# Patient Record
Sex: Female | Born: 1955 | Hispanic: Refuse to answer | Marital: Married | Smoking: Never smoker
Health system: Southern US, Community
[De-identification: ages and names within clinical notes are randomized; demographics above are authoritative.]

## PROBLEM LIST (undated history)

## (undated) DIAGNOSIS — T7840XA Allergy, unspecified, initial encounter: Secondary | ICD-10-CM

## (undated) DIAGNOSIS — E785 Hyperlipidemia, unspecified: Secondary | ICD-10-CM

## (undated) DIAGNOSIS — A0472 Enterocolitis due to Clostridium difficile, not specified as recurrent: Secondary | ICD-10-CM

## (undated) DIAGNOSIS — K219 Gastro-esophageal reflux disease without esophagitis: Secondary | ICD-10-CM

## (undated) DIAGNOSIS — I10 Essential (primary) hypertension: Secondary | ICD-10-CM

## (undated) DIAGNOSIS — H269 Unspecified cataract: Secondary | ICD-10-CM

## (undated) DIAGNOSIS — R011 Cardiac murmur, unspecified: Secondary | ICD-10-CM

## (undated) DIAGNOSIS — M1990 Unspecified osteoarthritis, unspecified site: Secondary | ICD-10-CM

## (undated) HISTORY — DX: Hyperlipidemia, unspecified: E78.5

## (undated) HISTORY — DX: Unspecified osteoarthritis, unspecified site: M19.90

## (undated) HISTORY — DX: Unspecified cataract: H26.9

## (undated) HISTORY — PX: CATARACT EXTRACTION: SUR2

## (undated) HISTORY — DX: Cardiac murmur, unspecified: R01.1

## (undated) HISTORY — DX: Enterocolitis due to Clostridium difficile, not specified as recurrent: A04.72

## (undated) HISTORY — DX: Essential (primary) hypertension: I10

## (undated) HISTORY — DX: Allergy, unspecified, initial encounter: T78.40XA

## (undated) HISTORY — PX: MIDDLE EAR SURGERY: SHX713

## (undated) HISTORY — PX: BREAST EXCISIONAL BIOPSY: SUR124

## (undated) HISTORY — PX: CARDIAC DEFIBRILLATOR PLACEMENT: SHX171

## (undated) HISTORY — DX: Gastro-esophageal reflux disease without esophagitis: K21.9

---

## 2005-03-17 DIAGNOSIS — R700 Elevated erythrocyte sedimentation rate: Secondary | ICD-10-CM | POA: Insufficient documentation

## 2006-12-04 DIAGNOSIS — D259 Leiomyoma of uterus, unspecified: Secondary | ICD-10-CM | POA: Insufficient documentation

## 2011-03-07 DIAGNOSIS — Z9889 Other specified postprocedural states: Secondary | ICD-10-CM | POA: Insufficient documentation

## 2011-03-29 DIAGNOSIS — I499 Cardiac arrhythmia, unspecified: Secondary | ICD-10-CM | POA: Insufficient documentation

## 2011-03-29 HISTORY — DX: Cardiac arrhythmia, unspecified: I49.9

## 2011-06-26 DIAGNOSIS — IMO0002 Reserved for concepts with insufficient information to code with codable children: Secondary | ICD-10-CM | POA: Insufficient documentation

## 2012-02-29 DIAGNOSIS — I4901 Ventricular fibrillation: Secondary | ICD-10-CM | POA: Insufficient documentation

## 2012-02-29 DIAGNOSIS — I469 Cardiac arrest, cause unspecified: Secondary | ICD-10-CM

## 2012-02-29 HISTORY — DX: Cardiac arrest, cause unspecified: I46.9

## 2012-03-05 DIAGNOSIS — J690 Pneumonitis due to inhalation of food and vomit: Secondary | ICD-10-CM

## 2012-03-05 HISTORY — DX: Pneumonitis due to inhalation of food and vomit: J69.0

## 2013-06-19 DIAGNOSIS — M5430 Sciatica, unspecified side: Secondary | ICD-10-CM | POA: Insufficient documentation

## 2014-08-23 DIAGNOSIS — R7303 Prediabetes: Secondary | ICD-10-CM | POA: Insufficient documentation

## 2014-08-23 DIAGNOSIS — R7301 Impaired fasting glucose: Secondary | ICD-10-CM | POA: Insufficient documentation

## 2014-09-23 DIAGNOSIS — Z95810 Presence of automatic (implantable) cardiac defibrillator: Secondary | ICD-10-CM | POA: Insufficient documentation

## 2015-10-22 DIAGNOSIS — M542 Cervicalgia: Secondary | ICD-10-CM | POA: Insufficient documentation

## 2015-11-19 DIAGNOSIS — M436 Torticollis: Secondary | ICD-10-CM | POA: Insufficient documentation

## 2016-05-06 DIAGNOSIS — N6021 Fibroadenosis of right breast: Secondary | ICD-10-CM | POA: Insufficient documentation

## 2016-06-02 DIAGNOSIS — R55 Syncope and collapse: Secondary | ICD-10-CM | POA: Insufficient documentation

## 2017-06-01 DIAGNOSIS — K648 Other hemorrhoids: Secondary | ICD-10-CM | POA: Insufficient documentation

## 2017-06-01 DIAGNOSIS — Z860100 Personal history of colon polyps, unspecified: Secondary | ICD-10-CM | POA: Insufficient documentation

## 2017-09-06 DIAGNOSIS — R590 Localized enlarged lymph nodes: Secondary | ICD-10-CM

## 2017-09-06 HISTORY — DX: Localized enlarged lymph nodes: R59.0

## 2019-04-15 DIAGNOSIS — N281 Cyst of kidney, acquired: Secondary | ICD-10-CM | POA: Insufficient documentation

## 2019-11-07 LAB — HM COLONOSCOPY

## 2020-12-07 DIAGNOSIS — M7072 Other bursitis of hip, left hip: Secondary | ICD-10-CM | POA: Insufficient documentation

## 2020-12-07 DIAGNOSIS — M7071 Other bursitis of hip, right hip: Secondary | ICD-10-CM | POA: Insufficient documentation

## 2021-01-24 DIAGNOSIS — R7309 Other abnormal glucose: Secondary | ICD-10-CM | POA: Insufficient documentation

## 2021-02-25 DIAGNOSIS — Z9622 Myringotomy tube(s) status: Secondary | ICD-10-CM | POA: Insufficient documentation

## 2021-02-25 DIAGNOSIS — Z961 Presence of intraocular lens: Secondary | ICD-10-CM | POA: Insufficient documentation

## 2021-03-31 DIAGNOSIS — R7303 Prediabetes: Secondary | ICD-10-CM | POA: Insufficient documentation

## 2021-05-17 NOTE — Progress Notes (Signed)
Electrophysiology Office Note:    Date:  05/18/2021   ID:  Christine Fitzpatrick, DOB 1956-03-26, MRN [id number]  PCP:  Pcp, No  [facility] Cardiologist:  None  [facility] Electrophysiologist:  Vickie Epley, MD   Referring MD: No ref. provider found   Chief Complaint: ICD establish care  History of Present Illness:    Christine Fitzpatrick is a 65 y.o. female who presents for an evaluation of ICD as a self-referral. The patient was previously followed by [name] with [facility].    The patient was seen via telemedicine on 12/16/2020. She has a history of SCA  while at work. Cath showed no CAD.   It seems like she had an SVT as well in the past but ablation was deferred. Patient with a recent generator replacement in March 2022.  Patient has a single-chamber Medtronic transvenous ICD.  Patient tells me that she has been on a variety of medications in the past for her SVT including metoprolol, bisoprolol, flecainide.  She has not tolerated long-term therapy with any of the above options.  Metoprolol was okay for a long period of time but it seems like eventually it caused lower blood pressures that were asymptomatic.  Because of the blood pressures, the medication was stopped.  Currently she is taking flecainide once a day only.  She is not on a beta-blocker.  Her ICD is never shocked her.   No past medical history on file.    Current Medications: Current Meds  Medication Sig   Ascorbic Acid (VITAMIN C) 100 MG tablet    atorvastatin (LIPITOR) 40 MG tablet Take 40 mg by mouth daily.   B Complex-C-Folic Acid (RENAL) 1 MG CAPS Take by mouth.   Cholecalciferol (VITAMIN D3) 10 MCG (400 UNIT) tablet Take by mouth.   Coenzyme Q10 100 MG capsule Take 1 capsule by mouth 2 (two) times daily.   flecainide (TAMBOCOR) 50 MG tablet Take 50 mg by mouth daily.   Omega-3 Fatty Acids (OMEGA-3 2100) 1050 MG CAPS Take by mouth.   pregabalin (LYRICA) 25 MG capsule Take 25 mg by mouth daily.      Allergies:   Patient has no allergy information on record.   Social History   Socioeconomic History   Marital status: Married    Spouse name: Not on file   Number of children: Not on file   Years of education: Not on file   Highest education level: Not on file  Occupational History   Not on file  Tobacco Use   Smoking status: Never   Smokeless tobacco: Never  Substance and Sexual Activity   Alcohol use: Yes   Drug use: Never   Sexual activity: Not on file  Other Topics Concern   Not on file  Social History Narrative   Not on file   Social Determinants of Health   Financial Resource Strain: Not on file  Food Insecurity: Not on file  Transportation Needs: Not on file  Physical Activity: Not on file  Stress: Not on file  Social Connections: Not on file     Family History: The patient's family history is not on file.  ROS:   Please see the history of present illness.    All other systems reviewed and are negative.  EKGs/Labs/Other Studies Reviewed:    The following studies were reviewed today:  May 18, 2021 in clinic device interrogation personally reviewed Lead parameter stable 80+ episodes of SVT with a regular rhythm at 150 bpm  on average.  Episodes typically last seconds to slightly over 1 minute No ICD therapies No programming changes made today.   12/16/2020 Stress (outside records) Summary 1. Myocardial perfusion imaging demonstrates a small-to-moderate sized area of mildly decreased perfusion in the anterior region near the base on both the rest and stress images. This perfusion abnormality is associated with normal wall motion and is most likely due to soft tissue attenuation artifact from overlying breast tissue. There are no areas of ischemia. 2. Left ventricular size and function are normal. 3. Clinical and EKG response: non-ischemic.     EKG:  The ekg ordered today demonstrates sinus rhythm, PVC, QTC 467.  PR interval 176 ms.  Recent  Labs: No results found for requested labs within last 8760 hours.  Recent Lipid Panel No results found for: CHOL, TRIG, HDL, CHOLHDL, VLDL, LDLCALC, LDLDIRECT  Physical Exam:    VS:  BP 134/86 (BP Location: Left Arm, Patient Position: Sitting, Cuff Size: Normal)   Pulse 98   Ht 5\' 8"  (1.727 m)   Wt 182 lb (82.6 kg)   SpO2 98%   BMI 27.67 kg/m     Wt Readings from Last 3 Encounters:  05/18/21 182 lb (82.6 kg)     GEN:  Well nourished, well developed in no acute distress HEENT: Normal NECK: No JVD; No carotid bruits LYMPHATICS: No lymphadenopathy CARDIAC: RRR, no murmurs, rubs, gallops.  Left chest ICD well-healed RESPIRATORY:  Clear to auscultation without rales, wheezing or rhonchi  ABDOMEN: Soft, non-tender, non-distended MUSCULOSKELETAL:  No edema; No deformity  SKIN: Warm and dry NEUROLOGIC:  Alert and oriented x 3 PSYCHIATRIC:  Normal affect   ASSESSMENT:    1. History of cardiac arrest   2. Ventricular tachycardia ([name])   3. SVT (supraventricular tachycardia) ([name])    PLAN:    In order of problems listed above:  1. History of cardiac arrest Now with ICD in situ.  ICD has never shocked her.  2. Ventricular tachycardia ([name]) ICD functioning well   3. SVT (supraventricular tachycardia) ([name]) Symptomatic.  Episodes typically last seconds at a time.  Review of ICD suggest repetitive SVT episodes that last typically a few seconds or slightly over 1 minute for one of the longest episodes last all.  Heart rates are typically 150 bpm.  Unfortunately there is no atrial data available on her single-chamber ICD.  We will plan to apply a 2-week ZIO monitor to see if we can acquire any atrial data suggestive of atrial flutter versus another SVT.  For now, I would like to discontinue her flecainide given she is only taking it once a day without beta-blockade.  I would like to restart low-dose Toprol-XL 25 mg by mouth once daily at night.  I will plan to see her back in  about 6 to 8 weeks to review the results of her ZIO monitor and to discuss neck steps for her SVT.  We will establish her in her device clinic for continued remote monitoring.        Total time spent with patient today 45 minutes. This includes reviewing records, evaluating the patient and coordinating care.  Medication Adjustments/Labs and Tests Ordered: Current medicines are reviewed at length with the patient today.  Concerns regarding medicines are outlined above.  No orders of the defined types were placed in this encounter.  No orders of the defined types were placed in this encounter.    Signed, Hilton Cork. Quentin Ore, MD, [facility], [facility] 05/18/2021 9:37 AM  Electrophysiology [facility]

## 2021-05-18 ENCOUNTER — Telehealth: Payer: Self-pay

## 2021-05-18 ENCOUNTER — Ambulatory Visit (INDEPENDENT_AMBULATORY_CARE_PROVIDER_SITE_OTHER): Payer: Managed Care, Other (non HMO) | Admitting: Cardiology

## 2021-05-18 ENCOUNTER — Other Ambulatory Visit: Payer: Self-pay

## 2021-05-18 ENCOUNTER — Ambulatory Visit (INDEPENDENT_AMBULATORY_CARE_PROVIDER_SITE_OTHER): Payer: Managed Care, Other (non HMO)

## 2021-05-18 ENCOUNTER — Encounter: Payer: Self-pay | Admitting: Cardiology

## 2021-05-18 VITALS — BP 134/86 | HR 98 | Ht 68.0 in | Wt 182.0 lb

## 2021-05-18 DIAGNOSIS — I472 Ventricular tachycardia, unspecified: Secondary | ICD-10-CM

## 2021-05-18 DIAGNOSIS — I471 Supraventricular tachycardia: Secondary | ICD-10-CM

## 2021-05-18 DIAGNOSIS — Z8674 Personal history of sudden cardiac arrest: Secondary | ICD-10-CM

## 2021-05-18 LAB — PACEMAKER DEVICE OBSERVATION

## 2021-05-18 MED ORDER — METOPROLOL SUCCINATE ER 25 MG PO TB24: 25.0000 mg | ORAL_TABLET | Freq: Every day | ORAL | 3 refills | Status: DC

## 2021-05-18 NOTE — Telephone Encounter (Signed)
Left message with answering service requesting callback from Pacemaker/ICD clinic to request transfer of Remote monitoring to our clinic.  Request also submitted in [facility].

## 2021-05-18 NOTE — Patient Instructions (Addendum)
Medication Instructions:  Your physician has recommended you make the following change in your medication:   1  STOP flecainide  2.  START taking metoprolol succinate 25 mg-  Take one tablet by mouth daily   Lab Work: None ordered. If you have labs (blood work) drawn today and your tests are completely normal, you will receive your results only by: [place] (if you have MyChart) OR A paper copy in the mail If you have any lab test that is abnormal or we need to change your treatment, we will call you to review the results.  Testing/Procedures: Your physician has recommended that you wear a holter monitor. Holter monitors are medical devices that record the heart's electrical activity. Doctors most often use these monitors to diagnose arrhythmias. Arrhythmias are problems with the speed or rhythm of the heartbeat. The monitor is a small, portable device. You can wear one while you do your normal daily activities. This is usually used to diagnose what is causing palpitations/syncope (passing out).  You will wear a ZIO monitor for 14 days.  Follow-Up: At [facility], you and your health needs are our priority.  As part of our continuing mission to provide you with exceptional heart care, we have created designated Provider Care Teams.  These Care Teams include your primary Cardiologist (physician) and Advanced Practice Providers (APPs -  Physician Assistants and Nurse Practitioners) who all work together to provide you with the care you need, when you need it.  Your next appointment:   Your physician wants you to follow-up in: 6-8 weeks with Dr. Quentin Ore.      Remote monitoring is used to monitor your ICD from home.   We will request to have your remote monitoring transferred to [facility].  Device clinic phone number:  [phone]  Your physician has recommended that you wear a Zio monitor.   This monitor is a medical device that records the heart's electrical activity.  Doctors most often use these monitors to diagnose arrhythmias. Arrhythmias are problems with the speed or rhythm of the heartbeat. The monitor is a small device applied to your chest. You can wear one while you do your normal daily activities. While wearing this monitor if you have any symptoms to push the button and record what you felt. Once you have worn this monitor for the period of time provider prescribed (Usually 14 days), you will return the monitor device in the postage paid box. Once it is returned they will download the data collected and provide [place] with a report which the provider will then review and we will call you with those results. Important tips:  Avoid showering during the first 24 hours of wearing the monitor. Avoid excessive sweating to help maximize wear time. Do not submerge the device, no hot tubs, and no swimming pools. Keep any lotions or oils away from the patch. After 24 hours you may shower with the patch on. Take brief showers with your back facing the shower head.  Do not remove patch once it has been placed because that will interrupt data and decrease adhesive wear time. Push the button when you have any symptoms and write down what you were feeling. Once you have completed wearing your monitor, remove and place into box which has postage paid and place in your outgoing mailbox.  If for some reason you have misplaced your box then call our office and we can provide another box and/or mail it off for you.

## 2021-05-20 ENCOUNTER — Ambulatory Visit (LOCAL_COMMUNITY_HEALTH_CENTER): Payer: Managed Care, Other (non HMO)

## 2021-05-20 ENCOUNTER — Other Ambulatory Visit: Payer: Self-pay

## 2021-05-20 DIAGNOSIS — Z111 Encounter for screening for respiratory tuberculosis: Secondary | ICD-10-CM

## 2021-05-20 NOTE — Telephone Encounter (Signed)
Pt is in our clinic as of 05-18-2021. She was put into our system by Amy, rn.

## 2021-05-23 ENCOUNTER — Ambulatory Visit (LOCAL_COMMUNITY_HEALTH_CENTER): Payer: Managed Care, Other (non HMO)

## 2021-05-23 DIAGNOSIS — Z111 Encounter for screening for respiratory tuberculosis: Secondary | ICD-10-CM

## 2021-05-23 LAB — TB SKIN TEST
Induration: 0 mm
TB Skin Test: NEGATIVE

## 2021-05-27 ENCOUNTER — Other Ambulatory Visit: Payer: Self-pay

## 2021-05-27 ENCOUNTER — Ambulatory Visit (LOCAL_COMMUNITY_HEALTH_CENTER): Payer: Self-pay

## 2021-05-27 DIAGNOSIS — Z0184 Encounter for antibody response examination: Secondary | ICD-10-CM

## 2021-05-27 NOTE — Progress Notes (Signed)
In Nurse Clinic for [place]. 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 Reports childhood vaccines, but no record. Limited vaccines in Epic and these were placed into [facility]. ROI signed. RN plans to call pt with results. Pt says she has MyChart but will need a paper copy of results. RN walked pt to lab. Josie Saunders, RN

## 2021-05-28 LAB — HEPATITIS B SURFACE ANTIBODY, QUANTITATIVE: Hepatitis B Surf Ab Quant: 221.8 m[IU]/mL (ref >9.9)

## 2021-05-29 LAB — MEASLES/MUMPS/RUBELLA IMMUNITY
MUMPS ABS, IGG: >300 AU/mL (ref >10.9)
RUBEOLA AB, IGG: >300 AU/mL (ref >16.4)
Rubella Antibodies, IGG: 1.36 index (ref >0.99)

## 2021-05-29 LAB — VARICELLA ZOSTER ANTIBODY, IGG: Varicella zoster IgG: >4000 index (ref >165)

## 2021-05-31 NOTE — Telephone Encounter (Signed)
Spoke with pt, advised we are now receiving her remote monitors.  Next scheduled check 08/17/21.

## 2021-06-06 ENCOUNTER — Encounter: Payer: Self-pay | Admitting: Internal Medicine

## 2021-06-06 ENCOUNTER — Other Ambulatory Visit: Payer: Self-pay

## 2021-06-06 ENCOUNTER — Ambulatory Visit (INDEPENDENT_AMBULATORY_CARE_PROVIDER_SITE_OTHER): Payer: Managed Care, Other (non HMO) | Admitting: Internal Medicine

## 2021-06-06 VITALS — BP 125/88 | HR 80 | Ht 68.0 in | Wt 183.4 lb

## 2021-06-06 DIAGNOSIS — I472 Ventricular tachycardia, unspecified: Secondary | ICD-10-CM | POA: Insufficient documentation

## 2021-06-06 DIAGNOSIS — Z8674 Personal history of sudden cardiac arrest: Secondary | ICD-10-CM

## 2021-06-06 DIAGNOSIS — I471 Supraventricular tachycardia, unspecified: Secondary | ICD-10-CM | POA: Insufficient documentation

## 2021-06-06 NOTE — Assessment & Plan Note (Signed)
Patient has a history of atrial flutter or supraventricular tachycardia she has a monitor placed to check the supraventricular arrhythmia.

## 2021-06-06 NOTE — Assessment & Plan Note (Signed)
Patient has a history of cardiac arrest ventricular tachycardia probably has an ICD placed in [place] which is working well.

## 2021-06-06 NOTE — Assessment & Plan Note (Signed)
>>  ASSESSMENT AND PLAN FOR VENTRICULAR TACHYCARDIA ([name]) WRITTEN ON 06/06/2021  9:59 AM BY MASOUD, JAVED, MD  Patient has a history of cardiac arrest ventricular tachycardia probably has an ICD placed in [place]  which is working well.

## 2021-06-06 NOTE — Assessment & Plan Note (Signed)
Patient has a cardiac arrest, she has an ICD placed.  She was on flecainide which was discontinued recently, she is on beta-blocker alone.

## 2021-06-06 NOTE — Progress Notes (Signed)
New Patient Office Visit  Subjective:  Patient ID: Christine Fitzpatrick, female    DOB: Jan 16, 1956  Age: 65 y.o. MRN: [id number]  CC:  Chief Complaint  Patient presents with   New Patient (Initial Visit)    HPI Patient presents for new pt visit  Past Medical History:  Diagnosis Date   Hyperlipidemia    Hypertension      Current Outpatient Medications:    Ascorbic Acid (VITAMIN C) 100 MG tablet, , Disp: , Rfl:    B Complex-C-Folic Acid (RENAL) 1 MG CAPS, Take by mouth., Disp: , Rfl:    Cholecalciferol (VITAMIN D3) 10 MCG (400 UNIT) tablet, Take by mouth., Disp: , Rfl:    Coenzyme Q10 100 MG capsule, Take 1 capsule by mouth 2 (two) times daily., Disp: , Rfl:    metoprolol succinate (TOPROL XL) 25 MG 24 hr tablet, Take 1 tablet (25 mg total) by mouth daily., Disp: 90 tablet, Rfl: 3   Omega-3 Fatty Acids (OMEGA-3 2100) 1050 MG CAPS, Take by mouth., Disp: , Rfl:    atorvastatin (LIPITOR) 40 MG tablet, Take 40 mg by mouth daily. (Patient not taking: No sig reported), Disp: , Rfl:    Past Surgical History:  Procedure Laterality Date   CARDIAC DEFIBRILLATOR PLACEMENT     CATARACT EXTRACTION Left    MIDDLE EAR SURGERY Left     Family History  Problem Relation Age of Onset   Hypertension Mother    Hypertension Maternal Aunt    Diabetes Maternal Grandmother     Social History   Socioeconomic History   Marital status: Married    Spouse name: Not on file   Number of children: Not on file   Years of education: Not on file   Highest education level: Not on file  Occupational History   Not on file  Tobacco Use   Smoking status: Never   Smokeless tobacco: Never  Substance and Sexual Activity   Alcohol use: Yes   Drug use: Never   Sexual activity: Not on file  Other Topics Concern   Not on file  Social History Narrative   Not on file   Social Determinants of Health   Financial Resource Strain: Not on file  Food Insecurity: Not on file  Transportation Needs: Not on file   Physical Activity: Not on file  Stress: Not on file  Social Connections: Not on file  Intimate Partner Violence: Not on file    ROS Review of Systems  Constitutional: Negative.   HENT: Negative.         Pe  tube  lt ear  Eyes:        Catract  surgery  lt eye  Respiratory: Negative.    Cardiovascular: Negative.        Defib lt side  Gastrointestinal: Negative.  Negative for abdominal distention, abdominal pain, anal bleeding, blood in stool and constipation.  Endocrine: Negative.   Genitourinary: Negative.   Musculoskeletal: Negative.        Epidural  injection  Skin: Negative.   Allergic/Immunologic: Negative.   Neurological: Negative.        Laminectomy L4/L5  Hematological: Negative.   Psychiatric/Behavioral: Negative.    All other systems reviewed and are negative.  Objective:   Today's Vitals: BP 125/88   Pulse 80   Ht '5\' 8"'$  (1.727 m)   Wt 183 lb 6.4 oz (83.2 kg)   BMI 27.89 kg/m   Physical Exam Constitutional:      Appearance: She  is normal weight.  HENT:     Head: Normocephalic.     Nose: Nose normal.     Mouth/Throat:     Mouth: Mucous membranes are moist.  Eyes:     Pupils: Pupils are equal, round, and reactive to light.  Cardiovascular:     Rate and Rhythm: Normal rate and regular rhythm.     Comments: Pt has defib  lt side due to cardiac  arrest Neurological:     Mental Status: She is alert.    Assessment & Plan:   Problem List Items Addressed This Visit       Cardiovascular and Mediastinum   Ventricular tachycardia [facility])    Patient has a history of cardiac arrest ventricular tachycardia probably has an ICD placed in [place] which is working well.       Supraventricular tachycardia ([place])    Patient has a history of atrial flutter or supraventricular tachycardia she has a monitor placed to check the supraventricular arrhythmia.         Other   History of cardiac arrest - Primary  Patient has an ICD on the left side  Outpatient  Encounter Medications as of 06/06/2021  Medication Sig   Ascorbic Acid (VITAMIN C) 100 MG tablet    B Complex-C-Folic Acid (RENAL) 1 MG CAPS Take by mouth.   Cholecalciferol (VITAMIN D3) 10 MCG (400 UNIT) tablet Take by mouth.   Coenzyme Q10 100 MG capsule Take 1 capsule by mouth 2 (two) times daily.   metoprolol succinate (TOPROL XL) 25 MG 24 hr tablet Take 1 tablet (25 mg total) by mouth daily.   Omega-3 Fatty Acids (OMEGA-3 2100) 1050 MG CAPS Take by mouth.   atorvastatin (LIPITOR) 40 MG tablet Take 40 mg by mouth daily. (Patient not taking: No sig reported)   [DISCONTINUED] pregabalin (LYRICA) 25 MG capsule Take 25 mg by mouth daily. (Patient not taking: Reported on 05/20/2021)   No facility-administered encounter medications on file as of 06/06/2021.       Follow-up: No follow-ups on file.   Cletis Athens, MD

## 2021-06-14 ENCOUNTER — Ambulatory Visit (INDEPENDENT_AMBULATORY_CARE_PROVIDER_SITE_OTHER): Payer: Managed Care, Other (non HMO) | Admitting: Internal Medicine

## 2021-06-14 ENCOUNTER — Other Ambulatory Visit: Payer: Self-pay

## 2021-06-14 ENCOUNTER — Encounter: Payer: Self-pay | Admitting: Internal Medicine

## 2021-06-14 VITALS — BP 121/73 | HR 89 | Ht 68.0 in | Wt 184.9 lb

## 2021-06-14 DIAGNOSIS — M1712 Unilateral primary osteoarthritis, left knee: Secondary | ICD-10-CM | POA: Diagnosis not present

## 2021-06-14 NOTE — Progress Notes (Signed)
Established Patient Office Visit  Subjective:  Patient ID: Christine Fitzpatrick, female    DOB: 06-Apr-1956  Age: 65 y.o. MRN: [id number]  CC:  Chief Complaint  Patient presents with   Knee Pain    Patient is requesting steroid shot in her knee     Knee Pain  The incident occurred more than 1 week ago. There was no injury mechanism. The pain is present in the left leg. The quality of the pain is described as aching. The pain is at a severity of 4/10.   Christine Fitzpatrick presents for lt knee pain Past Medical History:  Diagnosis Date   Hyperlipidemia    Hypertension     Past Surgical History:  Procedure Laterality Date   CARDIAC DEFIBRILLATOR PLACEMENT     CATARACT EXTRACTION Left    MIDDLE EAR SURGERY Left     Family History  Problem Relation Age of Onset   Hypertension Mother    Hypertension Maternal Aunt    Diabetes Maternal Grandmother     Social History   Socioeconomic History   Marital status: Married    Spouse name: Not on file   Number of children: Not on file   Years of education: Not on file   Highest education level: Not on file  Occupational History   Not on file  Tobacco Use   Smoking status: Never   Smokeless tobacco: Never  Substance and Sexual Activity   Alcohol use: Yes   Drug use: Never   Sexual activity: Not on file  Other Topics Concern   Not on file  Social History Narrative   Not on file   Social Determinants of Health   Financial Resource Strain: Not on file  Food Insecurity: Not on file  Transportation Needs: Not on file  Physical Activity: Not on file  Stress: Not on file  Social Connections: Not on file  Intimate Partner Violence: Not on file     Current Outpatient Medications:    Ascorbic Acid (VITAMIN C) 100 MG tablet, , Disp: , Rfl:    atorvastatin (LIPITOR) 40 MG tablet, Take 40 mg by mouth daily., Disp: , Rfl:    B Complex-C-Folic Acid (RENAL) 1 MG CAPS, Take by mouth., Disp: , Rfl:    Cholecalciferol (VITAMIN D3) 10 MCG (400  UNIT) tablet, Take by mouth., Disp: , Rfl:    Coenzyme Q10 100 MG capsule, Take 1 capsule by mouth 2 (two) times daily., Disp: , Rfl:    metoprolol succinate (TOPROL XL) 25 MG 24 hr tablet, Take 1 tablet (25 mg total) by mouth daily., Disp: 90 tablet, Rfl: 3   Omega-3 Fatty Acids (OMEGA-3 2100) 1050 MG CAPS, Take by mouth., Disp: , Rfl:    No Known Allergies  ROS Review of Systems  Constitutional: Negative.   HENT: Negative.    Eyes: Negative.   Respiratory: Negative.    Cardiovascular: Negative.   Gastrointestinal: Negative.   Endocrine: Negative.   Genitourinary: Negative.   Musculoskeletal: Negative.   Skin: Negative.   Allergic/Immunologic: Negative.   Neurological: Negative.   Hematological: Negative.   Psychiatric/Behavioral: Negative.    All other systems reviewed and are negative.    Objective:    Physical Exam Vitals reviewed.  Constitutional:      Appearance: Normal appearance.  HENT:     Mouth/Throat:     Mouth: Mucous membranes are moist.  Eyes:     Pupils: Pupils are equal, round, and reactive to light.  Neck:  Vascular: No carotid bruit.  Cardiovascular:     Rate and Rhythm: Normal rate and regular rhythm.     Pulses: Normal pulses.     Heart sounds: Normal heart sounds.  Pulmonary:     Effort: Pulmonary effort is normal.     Breath sounds: Normal breath sounds.  Abdominal:     General: Bowel sounds are normal.     Palpations: Abdomen is soft. There is no hepatomegaly, splenomegaly or mass.     Tenderness: There is no abdominal tenderness.     Hernia: No hernia is present.  Musculoskeletal:        General: No tenderness.     Cervical back: Neck supple.     Right lower leg: No edema.     Left lower leg: No edema.     Comments: Lt knee pain  Skin:    Findings: No rash.  Neurological:     Mental Status: She is alert and oriented to person, place, and time.     Motor: No weakness.  Psychiatric:        Mood and Affect: Mood and affect normal.         Behavior: Behavior normal.    BP 121/73   Pulse 89   Ht $R'5\' 8"'ca$  (1.727 m)   Wt 184 lb 14.4 oz (83.9 kg)   BMI 28.11 kg/m  Wt Readings from Last 3 Encounters:  06/14/21 184 lb 14.4 oz (83.9 kg)  06/06/21 183 lb 6.4 oz (83.2 kg)  05/18/21 182 lb (82.6 kg)     Health Maintenance Due  Topic Date Due   HIV Screening  Never done   Hepatitis C Screening  Never done   PAP SMEAR-Modifier  Never done   COLONOSCOPY (Pts 45-46yrs Insurance coverage will need to be confirmed)  Never done   MAMMOGRAM  Never done   TETANUS/TDAP  12/08/2018   INFLUENZA VACCINE  06/06/2021    There are no preventive care reminders to display for this patient.  No results found for: TSH No results found for: WBC, HGB, HCT, MCV, PLT No results found for: NA, K, CHLORIDE, CO2, GLUCOSE, BUN, CREATININE, BILITOT, ALKPHOS, AST, ALT, PROT, ALBUMIN, CALCIUM, ANIONGAP, EGFR, GFR No results found for: CHOL No results found for: HDL No results found for: LDLCALC No results found for: TRIG No results found for: CHOLHDL No results found for: HGBA1C    Assessment & Plan:   Problem List Items Addressed This Visit   None Joint Injection/Arthrocentesis  Date/Time: 06/14/2021 2:37 PM Performed by: Cletis Athens, MD Authorized by: Cletis Athens, MD  Indications: pain and joint swelling  Body area: knee Joint: left knee  Sedation: Patient sedated: no  Preparation: Patient was prepped and draped in the usual sterile fashion. Needle size: 22 G Ultrasound guidance: no Approach: medial Betamethasone amount: 40 mg Comments: Left knee was prepared and under sterile condition medial side of the left knee was injected with 40 mg of Kenalog.  Patient tolerated the procedure well.     No orders of the defined types were placed in this encounter.   Follow-up: No follow-ups on file.    Cletis Athens, MD

## 2021-06-14 NOTE — Assessment & Plan Note (Signed)
Patient has osteoarthritis of the left knee she was injected with 40 mg of Kenalog, patient tolerated the procedure well

## 2021-07-06 ENCOUNTER — Encounter: Payer: Self-pay | Admitting: Cardiology

## 2021-07-06 ENCOUNTER — Ambulatory Visit (INDEPENDENT_AMBULATORY_CARE_PROVIDER_SITE_OTHER): Payer: Managed Care, Other (non HMO) | Admitting: Cardiology

## 2021-07-06 ENCOUNTER — Other Ambulatory Visit: Payer: Self-pay

## 2021-07-06 VITALS — BP 120/84 | HR 95 | Ht 68.0 in | Wt 186.0 lb

## 2021-07-06 DIAGNOSIS — Z8674 Personal history of sudden cardiac arrest: Secondary | ICD-10-CM | POA: Diagnosis not present

## 2021-07-06 DIAGNOSIS — I472 Ventricular tachycardia, unspecified: Secondary | ICD-10-CM

## 2021-07-06 DIAGNOSIS — I471 Supraventricular tachycardia: Secondary | ICD-10-CM

## 2021-07-06 DIAGNOSIS — Z95810 Presence of automatic (implantable) cardiac defibrillator: Secondary | ICD-10-CM

## 2021-07-06 NOTE — Progress Notes (Signed)
Electrophysiology Office Follow up Visit Note:    Date:  07/06/2021   ID:  Christine Fitzpatrick, DOB 07/27/1956, MRN [id number]  PCP:  Cletis Athens, MD  [facility] Cardiologist:  None  [facility] Electrophysiologist:  Vickie Epley, MD    Interval History:    Christine Fitzpatrick is a 65 y.o. female who presents for a follow up visit. They were last seen in clinic May 18, 2021.  The patient was planning to establish care for her ICD.  She has a single-chamber Medtronic transvenous ICD.  She has a history of SVT but ablation was deferred in the past.  Given she has a single-chamber ICD, a Zio patch was applied at the last office visit to try to better characterize her SVT. Today she tells me she is doing very well.  She is almost done with a home remodel which has been the source of significant stress.    Past Medical History:  Diagnosis Date   Hyperlipidemia    Hypertension     Past Surgical History:  Procedure Laterality Date   CARDIAC DEFIBRILLATOR PLACEMENT     CATARACT EXTRACTION Left    MIDDLE EAR SURGERY Left     Current Medications: Current Meds  Medication Sig   Ascorbic Acid (VITAMIN C) 100 MG tablet    atorvastatin (LIPITOR) 10 MG tablet Take 10 mg by mouth daily.   cetirizine (ZYRTEC) 10 MG tablet Take 10 mg by mouth daily as needed for allergies.   Cholecalciferol (VITAMIN D3) 10 MCG (400 UNIT) tablet Take by mouth.   Coenzyme Q10 100 MG capsule Take 1 capsule by mouth 2 (two) times daily.   fluticasone (FLONASE) 50 MCG/ACT nasal spray Place into both nostrils daily as needed for allergies or rhinitis.   metoprolol succinate (TOPROL XL) 25 MG 24 hr tablet Take 1 tablet (25 mg total) by mouth daily.   Omega-3 Fatty Acids (OMEGA-3 2100) 1050 MG CAPS Take by mouth.     Allergies:   Patient has no known allergies.   Social History   Socioeconomic History   Marital status: Married    Spouse name: Not on file   Number of children: Not on file   Years of education:  Not on file   Highest education level: Not on file  Occupational History   Not on file  Tobacco Use   Smoking status: Never   Smokeless tobacco: Never  Substance and Sexual Activity   Alcohol use: Yes   Drug use: Never   Sexual activity: Not on file  Other Topics Concern   Not on file  Social History Narrative   Not on file   Social Determinants of Health   Financial Resource Strain: Not on file  Food Insecurity: Not on file  Transportation Needs: Not on file  Physical Activity: Not on file  Stress: Not on file  Social Connections: Not on file     Family History: The patient's family history includes Diabetes in her maternal grandmother; Hypertension in her maternal aunt and mother.  ROS:   Please see the history of present illness.    All other systems reviewed and are negative.  EKGs/Labs/Other Studies Reviewed:    The following studies were reviewed today:  June 07, 2021 ZIO personally reviewed HR 57 - 160bpm, average 83 bpm. 5 SVT, longest lasting 4 beats.  Rare supraventricular ectopy. 1.2% PVC burden. No sustained arrhythmias.  July 06, 2021 in clinic device interrogation personally reviewed Stable lead parameters no ICD therapies  delivered.  EKG:  The ekg ordered today demonstrates sinus rhythm.  Normal intervals.  Recent Labs: No results found for requested labs within last 8760 hours.  Recent Lipid Panel No results found for: CHOL, TRIG, HDL, CHOLHDL, VLDL, LDLCALC, LDLDIRECT  Physical Exam:    VS:  BP 120/84 (BP Location: Left Arm, Patient Position: Sitting, Cuff Size: Normal)   Pulse 95   Ht '5\' 8"'$  (1.727 m)   Wt 186 lb (84.4 kg)   SpO2 98%   BMI 28.28 kg/m     Wt Readings from Last 3 Encounters:  07/06/21 186 lb (84.4 kg)  06/14/21 184 lb 14.4 oz (83.9 kg)  06/06/21 183 lb 6.4 oz (83.2 kg)     GEN:  Well nourished, well developed in no acute distress HEENT: Normal NECK: No JVD; No carotid bruits LYMPHATICS: No  lymphadenopathy CARDIAC: RRR, no murmurs, rubs, gallops.  ICD pocket well-healed. RESPIRATORY:  Clear to auscultation without rales, wheezing or rhonchi  ABDOMEN: Soft, non-tender, non-distended MUSCULOSKELETAL:  No edema; No deformity  SKIN: Warm and dry NEUROLOGIC:  Alert and oriented x 3 PSYCHIATRIC:  Normal affect   ASSESSMENT:    1. SVT (supraventricular tachycardia) ([place])   2. Ventricular tachycardia ([place])   3. Implantable cardioverter-defibrillator (ICD) in situ    PLAN:    In order of problems listed above:   1. SVT (supraventricular tachycardia) ([name]) Very low burden on ZIO.  Doing well on metoprolol.  I would not restart flecainide.  She is very pleased with how she is feeling on metoprolol.  2. Ventricular tachycardia ([place]) No ICD therapies.  Doing well with defibrillator.  We will establish for remote monitoring with our clinic.  3. Implantable cardioverter-defibrillator (ICD) in situ Device interrogation shows a well-functioning device today.  We will establish her for remote monitoring in our clinic.    Medication Adjustments/Labs and Tests Ordered: Current medicines are reviewed at length with the patient today.  Concerns regarding medicines are outlined above.  No orders of the defined types were placed in this encounter.  No orders of the defined types were placed in this encounter.    Signed, Lars Mage, MD, [facility], [facility] 07/06/2021 2:45 PM    Electrophysiology [facility]

## 2021-07-06 NOTE — Patient Instructions (Signed)
Medication Instructions:  - Your physician recommends that you continue on your current medications as directed. Please refer to the Current Medication list given to you today.  *If you need a refill on your cardiac medications before your next appointment, please call your pharmacy*   Lab Work: - none ordered  If you have labs (blood work) drawn today and your tests are completely normal, you will receive your results only by: [place] (if you have MyChart) OR A paper copy in the mail If you have any lab test that is abnormal or we need to change your treatment, we will call you to review the results.   Testing/Procedures: - none ordered   Follow-Up: At [facility], you and your health needs are our priority.  As part of our continuing mission to provide you with exceptional heart care, we have created designated Provider Care Teams.  These Care Teams include your primary Cardiologist (physician) and Advanced Practice Providers (APPs -  Physician Assistants and Nurse Practitioners) who all work together to provide you with the care you need, when you need it.  We recommend signing up for the patient portal called "MyChart".  Sign up information is provided on this After Visit Summary.  MyChart is used to connect with patients for Virtual Visits (Telemedicine).  Patients are able to view lab/test results, encounter notes, upcoming appointments, etc.  Non-urgent messages can be sent to your provider as well.   To learn more about what you can do with MyChart, go to [email or web address].    Your next appointment:   1 year(s)  The format for your next appointment:   In Person  Provider:   Lars Mage, MD   Other Instructions N/a

## 2021-08-17 ENCOUNTER — Ambulatory Visit (INDEPENDENT_AMBULATORY_CARE_PROVIDER_SITE_OTHER): Payer: Managed Care, Other (non HMO)

## 2021-08-17 DIAGNOSIS — I471 Supraventricular tachycardia: Secondary | ICD-10-CM

## 2021-08-19 LAB — CUP PACEART REMOTE DEVICE CHECK
Battery Remaining Longevity: 160 mo
Battery Voltage: 3.05 V
Brady Statistic RA Percent Paced: INVALID
Brady Statistic RV Percent Paced: 0.13 %
Date Time Interrogation Session: 20221011183849
HighPow Impedance: 41 Ohm
HighPow Impedance: 50 Ohm
Implantable Lead Implant Date: 20130501
Implantable Lead Location: 753860
Implantable Pulse Generator Implant Date: 20220322
Lead Channel Impedance Value: 513 Ohm
Lead Channel Impedance Value: 589 Ohm
Lead Channel Pacing Threshold Amplitude: 0.5 V
Lead Channel Pacing Threshold Pulse Width: 0.4 ms
Lead Channel Sensing Intrinsic Amplitude: 6.9 mV
Lead Channel Setting Pacing Amplitude: 1.5 V
Lead Channel Setting Pacing Pulse Width: 0.4 ms
Lead Channel Setting Sensing Sensitivity: 0.45 mV

## 2021-08-25 NOTE — Progress Notes (Signed)
Remote ICD transmission.   

## 2021-10-06 ENCOUNTER — Encounter: Payer: Self-pay | Admitting: Internal Medicine

## 2021-10-18 ENCOUNTER — Other Ambulatory Visit: Payer: Self-pay | Admitting: *Deleted

## 2021-10-18 MED ORDER — AZITHROMYCIN 250 MG PO TABS: ORAL_TABLET | ORAL | 0 refills | Status: DC

## 2021-11-15 ENCOUNTER — Encounter: Payer: Self-pay | Admitting: Cardiology

## 2021-11-15 LAB — CUP PACEART REMOTE DEVICE CHECK
Battery Remaining Longevity: 158 mo
Battery Voltage: 3.05 V
Brady Statistic RV Percent Paced: 0.09 %
Date Time Interrogation Session: 20230110070245
HighPow Impedance: 41 Ohm
HighPow Impedance: 51 Ohm
Implantable Lead Implant Date: 20130501
Implantable Lead Location: 753860
Implantable Pulse Generator Implant Date: 20220322
Lead Channel Impedance Value: 532 Ohm
Lead Channel Impedance Value: 608 Ohm
Lead Channel Pacing Threshold Amplitude: 0.5 V
Lead Channel Pacing Threshold Pulse Width: 0.4 ms
Lead Channel Sensing Intrinsic Amplitude: 9.1 mV
Lead Channel Setting Pacing Amplitude: 1.5 V
Lead Channel Setting Pacing Pulse Width: 0.4 ms
Lead Channel Setting Sensing Sensitivity: 0.45 mV

## 2021-11-16 ENCOUNTER — Telehealth: Payer: Self-pay

## 2021-11-16 ENCOUNTER — Ambulatory Visit (INDEPENDENT_AMBULATORY_CARE_PROVIDER_SITE_OTHER): Payer: Managed Care, Other (non HMO)

## 2021-11-16 DIAGNOSIS — I472 Ventricular tachycardia, unspecified: Secondary | ICD-10-CM

## 2021-11-16 NOTE — Telephone Encounter (Signed)
Unsuccessful telephone encounter to patient to follow up on need to discuss SVT episodes noted on 11/15/21 remote transmission. Hipaa compliant VM message left requesting call back to [phone].

## 2021-11-16 NOTE — Telephone Encounter (Signed)
Returning patient call related to most recent transmission and her concern with number of SVT episodes. After review and discussion it appears most episodes occurred over the holidays of Christmas and Thanksgiving. Patient does admit to travel and spending time with family. Denies negative stress but embraces excitement with seeing family and doing family activities. Known history of SVT. Toprol XL prescribed. Will continue to monitor episodes. Patient appreciative of follow up.

## 2021-11-16 NOTE — Telephone Encounter (Signed)
Patient  called back returning call and would like a call back  

## 2021-11-21 ENCOUNTER — Encounter: Payer: Self-pay | Admitting: Internal Medicine

## 2021-11-21 ENCOUNTER — Other Ambulatory Visit: Payer: Self-pay

## 2021-11-21 ENCOUNTER — Ambulatory Visit (INDEPENDENT_AMBULATORY_CARE_PROVIDER_SITE_OTHER): Payer: Managed Care, Other (non HMO) | Admitting: Internal Medicine

## 2021-11-21 ENCOUNTER — Other Ambulatory Visit: Payer: Self-pay | Admitting: *Deleted

## 2021-11-21 VITALS — BP 130/81 | HR 77 | Ht 68.0 in | Wt 196.0 lb

## 2021-11-21 DIAGNOSIS — Z8674 Personal history of sudden cardiac arrest: Secondary | ICD-10-CM

## 2021-11-21 DIAGNOSIS — I472 Ventricular tachycardia, unspecified: Secondary | ICD-10-CM

## 2021-11-21 DIAGNOSIS — Z0000 Encounter for general adult medical examination without abnormal findings: Secondary | ICD-10-CM | POA: Insufficient documentation

## 2021-11-21 DIAGNOSIS — M1712 Unilateral primary osteoarthritis, left knee: Secondary | ICD-10-CM | POA: Diagnosis not present

## 2021-11-21 DIAGNOSIS — Z789 Other specified health status: Secondary | ICD-10-CM

## 2021-11-21 DIAGNOSIS — Z1231 Encounter for screening mammogram for malignant neoplasm of breast: Secondary | ICD-10-CM | POA: Diagnosis not present

## 2021-11-21 HISTORY — DX: Encounter for general adult medical examination without abnormal findings: Z00.00

## 2021-11-21 NOTE — Assessment & Plan Note (Signed)
Patient has a defibrillator

## 2021-11-21 NOTE — Progress Notes (Signed)
Established Patient Office Visit  Subjective:  Patient ID: Christine Fitzpatrick, female    DOB: September 03, 1956  Age: 66 y.o. MRN: [id number]  CC:  Chief Complaint  Patient presents with   Muscle Pain    Patient states that she is having muscle pain from the atorvastatin. Patient would like to change meds. Patient also wanting full panel of labs, but not due until march.     Muscle Pain   Christine Fitzpatrick presents for general check up, pt c/o muscle ahe with statin , so statin  was stopped  Past Medical History:  Diagnosis Date   Hyperlipidemia    Hypertension     Past Surgical History:  Procedure Laterality Date   CARDIAC DEFIBRILLATOR PLACEMENT     CATARACT EXTRACTION Left    MIDDLE EAR SURGERY Left     Family History  Problem Relation Age of Onset   Hypertension Mother    Hypertension Maternal Aunt    Diabetes Maternal Grandmother     Social History   Socioeconomic History   Marital status: Married    Spouse name: Not on file   Number of children: Not on file   Years of education: Not on file   Highest education level: Not on file  Occupational History   Not on file  Tobacco Use   Smoking status: Never   Smokeless tobacco: Never  Substance and Sexual Activity   Alcohol use: Yes   Drug use: Never   Sexual activity: Not on file  Other Topics Concern   Not on file  Social History Narrative   Not on file   Social Determinants of Health   Financial Resource Strain: Not on file  Food Insecurity: Not on file  Transportation Needs: Not on file  Physical Activity: Not on file  Stress: Not on file  Social Connections: Not on file  Intimate Partner Violence: Not on file     Current Outpatient Medications:    Ascorbic Acid (VITAMIN C) 100 MG tablet, , Disp: , Rfl:    cetirizine (ZYRTEC) 10 MG tablet, Take 10 mg by mouth daily as needed for allergies., Disp: , Rfl:    Cholecalciferol (VITAMIN D3) 10 MCG (400 UNIT) tablet, Take by mouth., Disp: , Rfl:    Coenzyme Q10 100  MG capsule, Take 1 capsule by mouth 2 (two) times daily., Disp: , Rfl:    fluticasone (FLONASE) 50 MCG/ACT nasal spray, Place into both nostrils daily as needed for allergies or rhinitis., Disp: , Rfl:    metoprolol succinate (TOPROL XL) 25 MG 24 hr tablet, Take 1 tablet (25 mg total) by mouth daily., Disp: 90 tablet, Rfl: 3   Omega-3 Fatty Acids (OMEGA-3 2100) 1050 MG CAPS, Take by mouth., Disp: , Rfl:    atorvastatin (LIPITOR) 10 MG tablet, Take 10 mg by mouth daily. (Patient not taking: Reported on 11/21/2021), Disp: , Rfl:    No Known Allergies  ROS Review of Systems  Constitutional: Negative.   HENT: Negative.    Eyes: Negative.   Respiratory: Negative.    Cardiovascular: Negative.   Gastrointestinal: Negative.   Endocrine: Negative.   Genitourinary: Negative.   Musculoskeletal: Negative.   Skin: Negative.   Allergic/Immunologic: Negative.   Neurological: Negative.   Hematological: Negative.   Psychiatric/Behavioral: Negative.    All other systems reviewed and are negative.    Objective:    Physical Exam Vitals reviewed.  Constitutional:      Appearance: Normal appearance.  HENT:     Mouth/Throat:  Mouth: Mucous membranes are moist.  Eyes:     Pupils: Pupils are equal, round, and reactive to light.  Neck:     Vascular: No carotid bruit.  Cardiovascular:     Rate and Rhythm: Normal rate and regular rhythm.     Pulses: Normal pulses.     Heart sounds: Normal heart sounds.  Pulmonary:     Effort: Pulmonary effort is normal.     Breath sounds: Normal breath sounds.  Abdominal:     General: Bowel sounds are normal.     Palpations: Abdomen is soft. There is no hepatomegaly, splenomegaly or mass.     Tenderness: There is no abdominal tenderness.     Hernia: No hernia is present.  Musculoskeletal:        General: No tenderness.     Cervical back: Neck supple.     Right lower leg: No edema.     Left lower leg: No edema.  Skin:    Findings: No rash.   Neurological:     Mental Status: She is alert and oriented to person, place, and time.     Motor: No weakness.  Psychiatric:        Mood and Affect: Mood and affect normal.        Behavior: Behavior normal.    BP 130/81    Pulse 77    Ht _0  (1.727 m)    Wt 196 lb (88.9 kg)    BMI 29.80 kg/m  Wt Readings from Last 3 Encounters:  11/21/21 196 lb (88.9 kg)  07/06/21 186 lb (84.4 kg)  06/14/21 184 lb 14.4 oz (83.9 kg)     Health Maintenance Due  Topic Date Due   HIV Screening  Never done   Hepatitis C Screening  Never done   PAP SMEAR-Modifier  Never done   COLONOSCOPY (Pts 45-57yr Insurance coverage will need to be confirmed)  Never done   MAMMOGRAM  Never done   COVID-19 Vaccine (4 - Booster for Janssen series) 04/04/2021   INFLUENZA VACCINE  06/06/2021    There are no preventive care reminders to display for this patient.  No results found for: TSH No results found for: WBC, HGB, HCT, MCV, PLT No results found for: NA, K, CHLORIDE, CO2, GLUCOSE, BUN, CREATININE, BILITOT, ALKPHOS, AST, ALT, PROT, ALBUMIN, CALCIUM, ANIONGAP, EGFR, GFR No results found for: CHOL No results found for: HDL No results found for: LDLCALC No results found for: TRIG No results found for: CHOLHDL No results found for: HGBA1C    Assessment & Plan:   Problem List Items Addressed This Visit       Cardiovascular and Mediastinum   Ventricular tachycardia    Patient has a defibrillator        Musculoskeletal and Integument   Primary osteoarthritis of left knee     Other   History of cardiac arrest    Patient has a defibrillator      Encounter for screening mammogram for malignant neoplasm of breast - Primary    We will schedule for mammogram      Relevant Orders   MM 3D SCREEN BREAST BILATERAL   Statin intolerance    Patient complaining of muscle pain due to statin  Statin was discontinued, patient was advised to take future 2 capsules twice a day,.  We will after 3 months  rechallange her  With crestor .       No orders of the defined types were placed in this encounter.  Follow-up: No follow-ups on file.    Christine Athens, MD

## 2021-11-21 NOTE — Assessment & Plan Note (Signed)
Patient complaining of muscle pain due to statin  Statin was discontinued, patient was advised to take future 2 capsules twice a day,.  We will after 3 months rechallange her  With crestor .

## 2021-11-21 NOTE — Assessment & Plan Note (Signed)
We will schedule for mammogram

## 2021-11-21 NOTE — Assessment & Plan Note (Signed)
>>  ASSESSMENT AND PLAN FOR VENTRICULAR TACHYCARDIA ([name]) WRITTEN ON 11/21/2021  1:47 PM BY BRITTA KING, MD  Patient has a defibrillator

## 2021-11-23 ENCOUNTER — Ambulatory Visit: Payer: Managed Care, Other (non HMO) | Admitting: *Deleted

## 2021-11-23 ENCOUNTER — Other Ambulatory Visit: Payer: Self-pay

## 2021-11-23 DIAGNOSIS — I472 Ventricular tachycardia, unspecified: Secondary | ICD-10-CM

## 2021-11-23 DIAGNOSIS — Z114 Encounter for screening for human immunodeficiency virus [HIV]: Secondary | ICD-10-CM

## 2021-11-23 DIAGNOSIS — Z0000 Encounter for general adult medical examination without abnormal findings: Secondary | ICD-10-CM

## 2021-11-23 DIAGNOSIS — Z1159 Encounter for screening for other viral diseases: Secondary | ICD-10-CM

## 2021-11-23 DIAGNOSIS — I471 Supraventricular tachycardia: Secondary | ICD-10-CM

## 2021-11-24 LAB — CBC WITH DIFFERENTIAL/PLATELET
Absolute Monocytes: 468 cells/uL (ref 200–950)
Basophils Absolute: 39 cells/uL (ref 0–200)
Basophils Relative: 0.6 %
Eosinophils Absolute: 260 cells/uL (ref 15–500)
Eosinophils Relative: 4 %
HCT: 42.2 % (ref 35.0–45.0)
Hemoglobin: 14 g/dL (ref 11.7–15.5)
Lymphs Abs: 2178 cells/uL (ref 850–3900)
MCH: 32.3 pg (ref 27.0–33.0)
MCHC: 33.2 g/dL (ref 32.0–36.0)
MCV: 97.5 fL (ref 80.0–100.0)
MPV: 10.1 fL (ref 7.5–12.5)
Monocytes Relative: 7.2 %
Neutro Abs: 3556 cells/uL (ref 1500–7800)
Neutrophils Relative %: 54.7 %
Platelets: 330 10*3/uL (ref 140–400)
RBC: 4.33 10*6/uL (ref 3.80–5.10)
RDW: 12.1 % (ref 11.0–15.0)
Total Lymphocyte: 33.5 %
WBC: 6.5 10*3/uL (ref 3.8–10.8)

## 2021-11-24 LAB — COMPLETE METABOLIC PANEL WITH GFR
AG Ratio: 1.6 (calc) (ref 1.0–2.5)
ALT: 26 U/L (ref 6–29)
AST: 21 U/L (ref 10–35)
Albumin: 4.1 g/dL (ref 3.6–5.1)
Alkaline phosphatase (APISO): 79 U/L (ref 37–153)
BUN: 9 mg/dL (ref 7–25)
CO2: 26 mmol/L (ref 20–32)
Calcium: 9.4 mg/dL (ref 8.6–10.4)
Chloride: 109 mmol/L (ref 98–110)
Creat: 0.89 mg/dL (ref 0.50–1.05)
Globulin: 2.5 g/dL (calc) (ref 1.9–3.7)
Glucose, Bld: 97 mg/dL (ref 65–99)
Potassium: 4.3 mmol/L (ref 3.5–5.3)
Sodium: 143 mmol/L (ref 135–146)
Total Bilirubin: 0.5 mg/dL (ref 0.2–1.2)
Total Protein: 6.6 g/dL (ref 6.1–8.1)
eGFR: 73 mL/min/{1.73_m2} (ref >=60)

## 2021-11-24 LAB — LIPID PANEL
Cholesterol: 180 mg/dL (ref <200)
HDL: 55 mg/dL (ref >=50)
LDL Cholesterol (Calc): 103 mg/dL (calc) — ABNORMAL HIGH
Non-HDL Cholesterol (Calc): 125 mg/dL (calc) (ref <130)
Total CHOL/HDL Ratio: 3.3 (calc) (ref <5.0)
Triglycerides: 127 mg/dL (ref <150)

## 2021-11-24 LAB — HIV ANTIBODY (ROUTINE TESTING W REFLEX): HIV 1&2 Ab, 4th Generation: NONREACTIVE

## 2021-11-24 LAB — HEPATITIS C ANTIBODY
Hepatitis C Ab: NONREACTIVE
SIGNAL TO CUT-OFF: 0.11 (ref <1.00)

## 2021-11-24 LAB — TSH: TSH: 1.45 mIU/L (ref 0.40–4.50)

## 2021-11-25 NOTE — Progress Notes (Signed)
Remote ICD transmission.   

## 2021-12-02 ENCOUNTER — Encounter: Payer: Self-pay | Admitting: *Deleted

## 2022-01-23 ENCOUNTER — Other Ambulatory Visit: Payer: Self-pay

## 2022-01-23 ENCOUNTER — Ambulatory Visit
Admission: RE | Admit: 2022-01-23 | Discharge: 2022-01-23 | Disposition: A | Discharge status: Home / Self Care | Payer: Managed Care, Other (non HMO) | Source: Ambulatory Visit | Attending: Internal Medicine | Admitting: Internal Medicine

## 2022-01-23 DIAGNOSIS — Z1231 Encounter for screening mammogram for malignant neoplasm of breast: Secondary | ICD-10-CM | POA: Diagnosis present

## 2022-01-24 ENCOUNTER — Encounter: Payer: Self-pay | Admitting: Cardiology

## 2022-01-28 ENCOUNTER — Ambulatory Visit
Admission: RE | Admit: 2022-01-28 | Discharge: 2022-01-28 | Disposition: A | Discharge status: Home / Self Care | Payer: Managed Care, Other (non HMO) | Source: Ambulatory Visit | Attending: Emergency Medicine | Admitting: Emergency Medicine

## 2022-01-28 ENCOUNTER — Other Ambulatory Visit: Payer: Self-pay

## 2022-01-28 VITALS — BP 123/82 | HR 103 | Temp 97.9°F | Resp 18

## 2022-01-28 DIAGNOSIS — N390 Urinary tract infection, site not specified: Secondary | ICD-10-CM | POA: Insufficient documentation

## 2022-01-28 DIAGNOSIS — R319 Hematuria, unspecified: Secondary | ICD-10-CM | POA: Diagnosis not present

## 2022-01-28 LAB — POCT URINALYSIS DIP (MANUAL ENTRY)
Bilirubin, UA: NEGATIVE
Glucose, UA: NEGATIVE mg/dL
Ketones, POC UA: NEGATIVE mg/dL
Nitrite, UA: NEGATIVE
Protein Ur, POC: 100 mg/dL — AB
Spec Grav, UA: 1.02 (ref 1.010–1.025)
Urobilinogen, UA: 0.2 E.U./dL
pH, UA: 5.5 (ref 5.0–8.0)

## 2022-01-28 MED ORDER — CEPHALEXIN 500 MG PO CAPS
500.0000 mg | ORAL_CAPSULE | Freq: Three times a day (TID) | ORAL | 0 refills | Status: AC
Start: 2022-01-28 — End: 2022-02-04

## 2022-01-28 NOTE — ED Triage Notes (Signed)
Pt here with some vaginal bleeding since yesterday. Occurs after urination and presents as spots on toilet tissue.  ?

## 2022-01-28 NOTE — ED Provider Notes (Signed)
?[facility] ? ? ? ?CSN: [id number] ?Arrival date & time: 01/28/22  0845 ? ? ?  ? ?History   ?Chief Complaint ?Chief Complaint  ?Patient presents with  ? Vaginal Bleeding  ?  65yo with spotting on Friday am after burning urination when wiped area. - Entered by patient  ? ? ?HPI ?Christine Fitzpatrick is a 66 y.o. female.  Patient presents with dysuria since yesterday.  She noted blood on the tissue when she wiped.  She is concerned for possible vaginal bleeding.  No blood noted since yesterday morning.  No treatments at home.  She denies fever, chills, abdominal pain, flank pain, vaginal discharge, pelvic pain, or other symptoms.  She is postmenopausal.  Her medical history includes hypertension, hyperlipidemia, history of cardiac arrest, ventricular tachycardia, osteoarthritis. ? ?The history is provided by the patient and medical records.  ? ?Past Medical History:  ?Diagnosis Date  ? Hyperlipidemia   ? Hypertension   ? ? ?Patient Active Problem List  ? Diagnosis Date Noted  ? Encounter for screening mammogram for malignant neoplasm of breast 11/21/2021  ? Statin intolerance 11/21/2021  ? Primary osteoarthritis of left knee 06/14/2021  ? History of cardiac arrest 06/06/2021  ? Ventricular tachycardia 06/06/2021  ? Supraventricular tachycardia ([place]) 06/06/2021  ? ? ?Past Surgical History:  ?Procedure Laterality Date  ? BREAST EXCISIONAL BIOPSY    ? CARDIAC DEFIBRILLATOR PLACEMENT    ? CATARACT EXTRACTION Left   ? MIDDLE EAR SURGERY Left   ? ? ?OB History   ?No obstetric history on file. ?  ? ? ? ?Home Medications   ? ?Prior to Admission medications   ?Medication Sig Start Date End Date Taking? Authorizing Provider  ?cephALEXin (KEFLEX) 500 MG capsule Take 1 capsule (500 mg total) by mouth 3 (three) times daily for 7 days. 01/28/22 02/04/22 Yes Sharion Balloon, NP  ?Ascorbic Acid (VITAMIN C) 100 MG tablet  03/07/19   [provider]  ?atorvastatin (LIPITOR) 10 MG tablet Take 10 mg by mouth daily. ?Patient not taking:  Reported on 11/21/2021 04/11/21   [provider]  ?cetirizine (ZYRTEC) 10 MG tablet Take 10 mg by mouth daily as needed for allergies.    [provider]  ?Cholecalciferol (VITAMIN D3) 10 MCG (400 UNIT) tablet Take by mouth.    [provider]  ?Coenzyme Q10 100 MG capsule Take 1 capsule by mouth 2 (two) times daily. 01/04/21   [provider]  ?fluticasone (FLONASE) 50 MCG/ACT nasal spray Place into both nostrils daily as needed for allergies or rhinitis.    [provider]  ?meloxicam (MOBIC) 15 MG tablet Take 15 mg by mouth daily. 01/26/22   [provider]  ?methylPREDNISolone (MEDROL DOSEPAK) 4 MG TBPK tablet Take by mouth. 01/24/22   [provider]  ?metoprolol succinate (TOPROL XL) 25 MG 24 hr tablet Take 1 tablet (25 mg total) by mouth daily. 05/18/21   Vickie Epley, MD  ?Omega-3 Fatty Acids (OMEGA-3 2100) 1050 MG CAPS Take by mouth.    [provider]  ? ? ?Family History ?Family History  ?Problem Relation Age of Onset  ? Hypertension Mother   ? Hypertension Maternal Aunt   ? Diabetes Maternal Grandmother   ? ? ?Social History ?Social History  ? ?Tobacco Use  ? Smoking status: Never  ? Smokeless tobacco: Never  ?Substance Use Topics  ? Alcohol use: Yes  ? Drug use: Never  ? ? ? ?Allergies   ?Patient has no known  allergies. ? ? ?Review of Systems ?Review of Systems  ?Constitutional:  Negative for chills and fever.  ?Gastrointestinal:  Negative for abdominal pain, diarrhea, nausea and vomiting.  ?Genitourinary:  Positive for dysuria and hematuria. Negative for flank pain, pelvic pain and vaginal discharge.  ?Skin:  Negative for color change and rash.  ?All other systems reviewed and are negative. ? ? ?Physical Exam ?Triage Vital Signs ?ED Triage Vitals  ?Enc Vitals Group  ?   BP   ?   Pulse   ?   Resp   ?   Temp   ?   Temp src   ?   SpO2   ?   Weight   ?   Height   ?   Head Circumference   ?   Peak Flow   ?   Pain Score   ?   Pain Loc   ?    Pain Edu?   ?   Excl. in [place]?   ? ?No data found. ? ?Updated Vital Signs ?BP 123/82   Pulse (!) 103   Temp 97.9 ?F (36.6 ?C)   Resp 18   SpO2 96%  ? ?Visual Acuity ?Right Eye Distance:   ?Left Eye Distance:   ?Bilateral Distance:   ? ?Right Eye Near:   ?Left Eye Near:    ?Bilateral Near:    ? ?Physical Exam ?Vitals and nursing note reviewed. Exam conducted with a chaperone present.  ?Constitutional:   ?   General: She is not in acute distress. ?   Appearance: She is well-developed. She is not ill-appearing.  ?HENT:  ?   Mouth/Throat:  ?   Mouth: Mucous membranes are moist.  ?Cardiovascular:  ?   Rate and Rhythm: Normal rate and regular rhythm.  ?   Heart sounds: Normal heart sounds.  ?Pulmonary:  ?   Effort: Pulmonary effort is normal. No respiratory distress.  ?   Breath sounds: Normal breath sounds.  ?Abdominal:  ?   General: Bowel sounds are normal.  ?   Palpations: Abdomen is soft.  ?   Tenderness: There is no abdominal tenderness. There is no right CVA tenderness, left CVA tenderness, guarding or rebound.  ?Genitourinary: ?   Exam position: Lithotomy position.  ?   Vagina: Normal. No bleeding.  ?Musculoskeletal:  ?   Cervical back: Neck supple.  ?Skin: ?   General: Skin is warm and dry.  ?Neurological:  ?   Mental Status: She is alert.  ?Psychiatric:     ?   Mood and Affect: Mood normal.     ?   Behavior: Behavior normal.  ? ? ? ?UC Treatments / Results  ?Labs ?(all labs ordered are listed, but only abnormal results are displayed) ?Labs Reviewed  ?POCT URINALYSIS DIP (MANUAL ENTRY) - Abnormal; Notable for the following components:  ?    Result Value  ? Clarity, UA cloudy (*)   ? Blood, UA large (*)   ? Protein Ur, POC =100 (*)   ? Leukocytes, UA Moderate (2+) (*)   ? All other components within normal limits  ?URINE CULTURE  ? ? ?EKG ? ? ?Radiology ?No results found. ? ?Procedures ?Procedures (including critical care time) ? ?Medications Ordered in UC ?Medications - No data to display ? ?Initial Impression  / Assessment and Plan / UC Course  ?I have reviewed the triage vital signs and the nursing notes. ? ?Pertinent labs & imaging results that were available during my care of the patient  were reviewed by me and considered in my medical decision making (see chart for details). ? ?Hematuria, UTI.  No blood noted in vaginal canal.  Urine has large blood and moderate leukocytes.  Treating with Keflex. Urine culture pending. Discussed with patient that we will call her if the urine culture shows the need to change or discontinue the antibiotic. Instructed her to follow-up with her PCP or gynecologist in 1 week for recheck.. Patient agrees to plan of care.    ? ? ?Final Clinical Impressions(s) / UC Diagnoses  ? ?Final diagnoses:  ?Hematuria, unspecified type  ?Urinary tract infection with hematuria, site unspecified  ? ? ? ?Discharge Instructions   ? ?  ?Take the antibiotic as directed.  The urine culture is pending.  We will call you if it shows the need to change or discontinue your antibiotic.   ? ?Follow up with your primary care provider or gynecologist for a recheck in 1 week.   ? ? ? ? ? ? ? ?ED Prescriptions   ? ? Medication Sig Dispense Auth. Provider  ? cephALEXin (KEFLEX) 500 MG capsule Take 1 capsule (500 mg total) by mouth 3 (three) times daily for 7 days. 21 capsule Sharion Balloon, NP  ? ?  ? ?PDMP not reviewed this encounter. ?  ?Sharion Balloon, NP ?01/28/22 [phone] ? ?

## 2022-01-28 NOTE — Discharge Instructions (Addendum)
Take the antibiotic as directed.  The urine culture is pending.  We will call you if it shows the need to change or discontinue your antibiotic.   ? ?Follow up with your primary care provider or gynecologist for a recheck in 1 week.   ? ? ? ?

## 2022-01-31 LAB — URINE CULTURE: Culture: >=100000 — AB

## 2022-02-15 ENCOUNTER — Ambulatory Visit (INDEPENDENT_AMBULATORY_CARE_PROVIDER_SITE_OTHER): Payer: Managed Care, Other (non HMO)

## 2022-02-15 DIAGNOSIS — I472 Ventricular tachycardia, unspecified: Secondary | ICD-10-CM

## 2022-02-16 LAB — CUP PACEART REMOTE DEVICE CHECK
Battery Remaining Longevity: 154 mo
Battery Voltage: 3.04 V
Brady Statistic RV Percent Paced: 0.06 %
Date Time Interrogation Session: 20230411201342
HighPow Impedance: 40 Ohm
HighPow Impedance: 51 Ohm
Implantable Lead Implant Date: 20130501
Implantable Lead Location: 753860
Implantable Pulse Generator Implant Date: 20220322
Lead Channel Impedance Value: 513 Ohm
Lead Channel Impedance Value: 570 Ohm
Lead Channel Pacing Threshold Amplitude: 0.5 V
Lead Channel Pacing Threshold Pulse Width: 0.4 ms
Lead Channel Sensing Intrinsic Amplitude: 7.1 mV
Lead Channel Setting Pacing Amplitude: 1.5 V
Lead Channel Setting Pacing Pulse Width: 0.4 ms
Lead Channel Setting Sensing Sensitivity: 0.45 mV

## 2022-03-03 NOTE — Progress Notes (Signed)
Remote ICD transmission.   

## 2022-03-19 ENCOUNTER — Encounter (INDEPENDENT_AMBULATORY_CARE_PROVIDER_SITE_OTHER): Payer: Managed Care, Other (non HMO) | Admitting: Cardiology

## 2022-03-19 DIAGNOSIS — I471 Supraventricular tachycardia: Secondary | ICD-10-CM | POA: Diagnosis not present

## 2022-03-20 ENCOUNTER — Encounter: Payer: Self-pay | Admitting: Cardiology

## 2022-03-20 ENCOUNTER — Ambulatory Visit (INDEPENDENT_AMBULATORY_CARE_PROVIDER_SITE_OTHER): Payer: Managed Care, Other (non HMO)

## 2022-03-20 DIAGNOSIS — E785 Hyperlipidemia, unspecified: Secondary | ICD-10-CM | POA: Diagnosis not present

## 2022-03-21 ENCOUNTER — Encounter: Payer: Self-pay | Admitting: Internal Medicine

## 2022-03-21 ENCOUNTER — Other Ambulatory Visit: Payer: Self-pay | Admitting: *Deleted

## 2022-03-21 LAB — LIPID PANEL
Cholesterol: 248 mg/dL — ABNORMAL HIGH (ref <200)
HDL: 54 mg/dL (ref >=50)
LDL Cholesterol (Calc): 164 mg/dL (calc) — ABNORMAL HIGH
Non-HDL Cholesterol (Calc): 194 mg/dL (calc) — ABNORMAL HIGH (ref <130)
Total CHOL/HDL Ratio: 4.6 (calc) (ref <5.0)
Triglycerides: 157 mg/dL — ABNORMAL HIGH (ref <150)

## 2022-03-21 MED ORDER — ROSUVASTATIN CALCIUM 10 MG PO TABS: 10.0000 mg | ORAL_TABLET | Freq: Every day | ORAL | 3 refills | Status: DC

## 2022-03-27 NOTE — Telephone Encounter (Signed)
Please see the MyChart message reply(ies) for my assessment and plan.    This patient gave consent for this Medical Advice Message and is aware that it may result in a bill to [employer], as well as the possibility of receiving a bill for a co-payment or deductible. They are an established patient, but are not seeking medical advice exclusively about a problem treated during an in person or video visit in the last seven days. I did not recommend an in person or video visit within seven days of my reply.    I spent a total of 15 minutes cumulative time within 7 days through [employer].  Vickie Epley, MD

## 2022-04-12 ENCOUNTER — Encounter: Payer: Self-pay | Admitting: Cardiology

## 2022-04-17 ENCOUNTER — Encounter: Payer: Self-pay | Admitting: Internal Medicine

## 2022-04-17 ENCOUNTER — Ambulatory Visit (INDEPENDENT_AMBULATORY_CARE_PROVIDER_SITE_OTHER): Payer: Managed Care, Other (non HMO) | Admitting: Internal Medicine

## 2022-04-17 VITALS — BP 127/75 | HR 76 | Ht 68.0 in | Wt 193.5 lb

## 2022-04-17 DIAGNOSIS — Z789 Other specified health status: Secondary | ICD-10-CM | POA: Diagnosis not present

## 2022-04-17 DIAGNOSIS — I471 Supraventricular tachycardia: Secondary | ICD-10-CM

## 2022-04-17 DIAGNOSIS — R109 Unspecified abdominal pain: Secondary | ICD-10-CM | POA: Diagnosis not present

## 2022-04-17 LAB — POCT URINALYSIS DIPSTICK
Bilirubin, UA: NEGATIVE
Blood, UA: POSITIVE
Glucose, UA: NEGATIVE
Ketones, UA: NEGATIVE
Leukocytes, UA: NEGATIVE
Nitrite, UA: NEGATIVE
Protein, UA: POSITIVE — AB
Spec Grav, UA: 1.02 (ref 1.010–1.025)
Urobilinogen, UA: NEGATIVE E.U./dL — AB
pH, UA: 6 (ref 5.0–8.0)

## 2022-04-17 MED ORDER — CEPHALEXIN 500 MG PO CAPS: 500.0000 mg | ORAL_CAPSULE | Freq: Two times a day (BID) | ORAL | 0 refills | Status: DC

## 2022-04-17 NOTE — Assessment & Plan Note (Signed)
Patient has a defibrillator, being followed up by Dr. Quentin Ore

## 2022-04-17 NOTE — Assessment & Plan Note (Signed)
Patient is taking Crestor and she is tolerating it well

## 2022-04-17 NOTE — Assessment & Plan Note (Signed)
Patient has right flank pain with hematuria, will give her Keflex for 7 days also do an ultrasound of the kidney to make sure she does not have a kidney stone or any other kidney problem

## 2022-04-17 NOTE — Progress Notes (Signed)
Established Patient Office Visit  Subjective:  Patient ID: Christine Fitzpatrick, female    DOB: 1956-05-04  Age: 66 y.o. MRN: [id number]  CC:  Chief Complaint  Patient presents with   Flank Pain    Patient has been having right flank pain x 2 days. Patient also has had some frequency with urination but unsure if it is because she is drinking a lot of water. Patient had uti about 2 months ago and is afraid it may be coming back.     Flank Pain    Christine Fitzpatrick presents for patient complaining of right flank pain.  There is no radiation of the pain to the bladder area or in the back.  She also has a history of hematuria, she does not drink or smoke, patient has a defibrillator.  Past Medical History:  Diagnosis Date   Hyperlipidemia    Hypertension     Past Surgical History:  Procedure Laterality Date   BREAST EXCISIONAL BIOPSY     CARDIAC DEFIBRILLATOR PLACEMENT     CATARACT EXTRACTION Left    MIDDLE EAR SURGERY Left     Family History  Problem Relation Age of Onset   Hypertension Mother    Hypertension Maternal Aunt    Diabetes Maternal Grandmother     Social History   Socioeconomic History   Marital status: Married    Spouse name: Not on file   Number of children: Not on file   Years of education: Not on file   Highest education level: Not on file  Occupational History   Not on file  Tobacco Use   Smoking status: Never   Smokeless tobacco: Never  Substance and Sexual Activity   Alcohol use: Yes   Drug use: Never   Sexual activity: Not on file  Other Topics Concern   Not on file  Social History Narrative   Not on file   Social Determinants of Health   Financial Resource Strain: Not on file  Food Insecurity: Not on file  Transportation Needs: Not on file  Physical Activity: Not on file  Stress: Not on file  Social Connections: Not on file  Intimate Partner Violence: Not on file     Current Outpatient Medications:    Ascorbic Acid (VITAMIN C) 100 MG  tablet, , Disp: , Rfl:    cephALEXin (KEFLEX) 500 MG capsule, Take 1 capsule (500 mg total) by mouth 2 (two) times daily., Disp: 14 capsule, Rfl: 0   cetirizine (ZYRTEC) 10 MG tablet, Take 10 mg by mouth daily as needed for allergies., Disp: , Rfl:    Cholecalciferol (VITAMIN D3) 10 MCG (400 UNIT) tablet, Take by mouth., Disp: , Rfl:    Coenzyme Q10 100 MG capsule, Take 1 capsule by mouth 2 (two) times daily., Disp: , Rfl:    fluticasone (FLONASE) 50 MCG/ACT nasal spray, Place into both nostrils daily as needed for allergies or rhinitis., Disp: , Rfl:    meloxicam (MOBIC) 15 MG tablet, Take 15 mg by mouth daily., Disp: , Rfl:    methylPREDNISolone (MEDROL DOSEPAK) 4 MG TBPK tablet, Take by mouth., Disp: , Rfl:    metoprolol succinate (TOPROL XL) 25 MG 24 hr tablet, Take 1 tablet (25 mg total) by mouth daily., Disp: 90 tablet, Rfl: 3   Omega-3 Fatty Acids (OMEGA-3 2100) 1050 MG CAPS, Take by mouth., Disp: , Rfl:    rosuvastatin (CRESTOR) 10 MG tablet, Take 1 tablet (10 mg total) by mouth daily., Disp: 90 tablet, Rfl: 3  atorvastatin (LIPITOR) 10 MG tablet, Take 10 mg by mouth daily. (Patient not taking: Reported on 11/21/2021), Disp: , Rfl:    No Known Allergies  ROS Review of Systems  Constitutional: Negative.   HENT: Negative.    Eyes: Negative.   Respiratory: Negative.    Cardiovascular: Negative.   Gastrointestinal: Negative.   Endocrine: Negative.   Genitourinary:  Positive for flank pain.  Skin: Negative.   Allergic/Immunologic: Negative.   Neurological: Negative.   Hematological: Negative.   Psychiatric/Behavioral: Negative.    All other systems reviewed and are negative.     Objective:    Physical Exam Vitals reviewed.  Constitutional:      Appearance: Normal appearance.  HENT:     Mouth/Throat:     Mouth: Mucous membranes are moist.  Eyes:     Pupils: Pupils are equal, round, and reactive to light.  Neck:     Vascular: No carotid bruit.  Cardiovascular:      Rate and Rhythm: Normal rate and regular rhythm.     Pulses: Normal pulses.     Heart sounds: Normal heart sounds.  Pulmonary:     Effort: Pulmonary effort is normal.     Breath sounds: Normal breath sounds.  Abdominal:     General: Bowel sounds are normal.     Palpations: Abdomen is soft. There is no hepatomegaly, splenomegaly or mass.     Tenderness: There is no abdominal tenderness.     Hernia: No hernia is present.  Musculoskeletal:        General: No tenderness.     Cervical back: Neck supple.     Right lower leg: No edema.     Left lower leg: No edema.  Skin:    Findings: No rash.  Neurological:     Mental Status: She is alert and oriented to person, place, and time.     Motor: No weakness.  Psychiatric:        Mood and Affect: Mood and affect normal.        Behavior: Behavior normal.     BP 127/75   Pulse 76   Ht 5\' 8"  (1.727 m)   Wt 193 lb 8 oz (87.8 kg)   BMI 29.42 kg/m  Wt Readings from Last 3 Encounters:  04/17/22 193 lb 8 oz (87.8 kg)  11/21/21 196 lb (88.9 kg)  07/06/21 186 lb (84.4 kg)     Health Maintenance Due  Topic Date Due   PAP SMEAR-Modifier  Never done   COVID-19 Vaccine (4 - Booster for Janssen series) 04/04/2021    There are no preventive care reminders to display for this patient.  Lab Results  Component Value Date   TSH 1.45 11/23/2021   Lab Results  Component Value Date   WBC 6.5 11/23/2021   HGB 14.0 11/23/2021   HCT 42.2 11/23/2021   MCV 97.5 11/23/2021   PLT 330 11/23/2021   Lab Results  Component Value Date   NA 143 11/23/2021   K 4.3 11/23/2021   CO2 26 11/23/2021   GLUCOSE 97 11/23/2021   BUN 9 11/23/2021   CREATININE 0.89 11/23/2021   BILITOT 0.5 11/23/2021   AST 21 11/23/2021   ALT 26 11/23/2021   PROT 6.6 11/23/2021   CALCIUM 9.4 11/23/2021   EGFR 73 11/23/2021   Lab Results  Component Value Date   CHOL 248 (H) 03/20/2022   Lab Results  Component Value Date   HDL 54 03/20/2022   Lab Results  Component Value Date   LDLCALC 164 (H) 03/20/2022   Lab Results  Component Value Date   TRIG 157 (H) 03/20/2022   Lab Results  Component Value Date   CHOLHDL 4.6 03/20/2022   No results found for: "HGBA1C"    Assessment & Plan:   Problem List Items Addressed This Visit       Cardiovascular and Mediastinum   Supraventricular tachycardia ([place])    Patient has a defibrillator, being followed up by Dr. Quentin Ore        Other   Statin intolerance    Patient is taking Crestor and she is tolerating it well      Flank pain    Patient has right flank pain with hematuria, will give her Keflex for 7 days also do an ultrasound of the kidney to make sure she does not have a kidney stone or any other kidney problem      Other Visit Diagnoses     Right flank pain    -  Primary   Relevant Orders   POCT urinalysis dipstick (Completed)   US Renal       Meds ordered this encounter  Medications   cephALEXin (KEFLEX) 500 MG capsule    Sig: Take 1 capsule (500 mg total) by mouth 2 (two) times daily.    Dispense:  14 capsule    Refill:  0    Follow-up: No follow-ups on file.    Cletis Athens, MD

## 2022-04-27 ENCOUNTER — Ambulatory Visit
Admission: RE | Admit: 2022-04-27 | Discharge: 2022-04-27 | Disposition: A | Discharge status: Home / Self Care | Payer: Managed Care, Other (non HMO) | Source: Ambulatory Visit | Attending: Internal Medicine | Admitting: Internal Medicine

## 2022-04-27 DIAGNOSIS — R109 Unspecified abdominal pain: Secondary | ICD-10-CM

## 2022-05-01 ENCOUNTER — Encounter: Payer: Self-pay | Admitting: Internal Medicine

## 2022-05-01 ENCOUNTER — Ambulatory Visit (INDEPENDENT_AMBULATORY_CARE_PROVIDER_SITE_OTHER): Payer: Managed Care, Other (non HMO) | Admitting: Internal Medicine

## 2022-05-01 VITALS — BP 130/80 | HR 82 | Ht 68.0 in | Wt 193.5 lb

## 2022-05-01 DIAGNOSIS — M1712 Unilateral primary osteoarthritis, left knee: Secondary | ICD-10-CM

## 2022-05-01 DIAGNOSIS — Z789 Other specified health status: Secondary | ICD-10-CM | POA: Diagnosis not present

## 2022-05-01 DIAGNOSIS — I471 Supraventricular tachycardia: Secondary | ICD-10-CM

## 2022-05-01 DIAGNOSIS — R109 Unspecified abdominal pain: Secondary | ICD-10-CM | POA: Diagnosis not present

## 2022-05-01 LAB — POCT URINALYSIS DIPSTICK
Bilirubin, UA: NEGATIVE
Blood, UA: NEGATIVE
Glucose, UA: NEGATIVE
Ketones, UA: NEGATIVE
Leukocytes, UA: NEGATIVE
Nitrite, UA: NEGATIVE
Protein, UA: NEGATIVE
Spec Grav, UA: 1.015 (ref 1.010–1.025)
Urobilinogen, UA: 0.2 E.U./dL
pH, UA: 6 (ref 5.0–8.0)

## 2022-05-01 NOTE — Progress Notes (Signed)
Established Patient Office Visit  Subjective:  Patient ID: Christine Fitzpatrick, female    DOB: [birth year]  Age: 66 y.o. MRN: [id number]  CC:  Chief Complaint  Patient presents with   ultrasound results     Patient here today to discuss results of renal ultrasound     HPI  Christine Fitzpatrick presents for rt flank pain  Past Medical History:  Diagnosis Date   Hyperlipidemia    Hypertension     Past Surgical History:  Procedure Laterality Date   BREAST EXCISIONAL BIOPSY     CARDIAC DEFIBRILLATOR PLACEMENT     CATARACT EXTRACTION Left    MIDDLE EAR SURGERY Left     Family History  Problem Relation Age of Onset   Hypertension Mother    Hypertension Maternal Aunt    Diabetes Maternal Grandmother     Social History   Socioeconomic History   Marital status: Married    Spouse name: Not on file   Number of children: Not on file   Years of education: Not on file   Highest education level: Not on file  Occupational History   Not on file  Tobacco Use   Smoking status: Never   Smokeless tobacco: Never  Substance and Sexual Activity   Alcohol use: Yes   Drug use: Never   Sexual activity: Not on file  Other Topics Concern   Not on file  Social History Narrative   Not on file   Social Determinants of Health   Financial Resource Strain: Not on file  Food Insecurity: Not on file  Transportation Needs: Not on file  Physical Activity: Not on file  Stress: Not on file  Social Connections: Not on file  Intimate Partner Violence: Not on file     Current Outpatient Medications:    Ascorbic Acid (VITAMIN C) 100 MG tablet, , Disp: , Rfl:    cephALEXin (KEFLEX) 500 MG capsule, Take 1 capsule (500 mg total) by mouth 2 (two) times daily., Disp: 14 capsule, Rfl: 0   cetirizine (ZYRTEC) 10 MG tablet, Take 10 mg by mouth daily as needed for allergies., Disp: , Rfl:    Cholecalciferol (VITAMIN D3) 10 MCG (400 UNIT) tablet, Take by mouth., Disp: , Rfl:    Coenzyme Q10 100 MG capsule, Take  1 capsule by mouth 2 (two) times daily., Disp: , Rfl:    fluticasone (FLONASE) 50 MCG/ACT nasal spray, Place into both nostrils daily as needed for allergies or rhinitis., Disp: , Rfl:    meloxicam (MOBIC) 15 MG tablet, Take 15 mg by mouth daily., Disp: , Rfl:    methylPREDNISolone (MEDROL DOSEPAK) 4 MG TBPK tablet, Take by mouth., Disp: , Rfl:    metoprolol succinate (TOPROL XL) 25 MG 24 hr tablet, Take 1 tablet (25 mg total) by mouth daily., Disp: 90 tablet, Rfl: 3   Omega-3 Fatty Acids (OMEGA-3 2100) 1050 MG CAPS, Take by mouth., Disp: , Rfl:    rosuvastatin (CRESTOR) 10 MG tablet, Take 1 tablet (10 mg total) by mouth daily., Disp: 90 tablet, Rfl: 3   atorvastatin (LIPITOR) 10 MG tablet, Take 10 mg by mouth daily. (Patient not taking: Reported on 11/21/2021), Disp: , Rfl:    No Known Allergies  ROS Review of Systems  Constitutional: Negative.   HENT: Negative.    Eyes: Negative.   Respiratory: Negative.    Cardiovascular: Negative.   Gastrointestinal: Negative.   Endocrine: Negative.   Genitourinary: Negative.   Musculoskeletal: Negative.   Skin: Negative.  Allergic/Immunologic: Negative.   Neurological: Negative.   Hematological: Negative.   Psychiatric/Behavioral: Negative.    All other systems reviewed and are negative.     Objective:    Physical Exam Vitals reviewed.  Constitutional:      Appearance: Normal appearance.  HENT:     Mouth/Throat:     Mouth: Mucous membranes are moist.  Eyes:     Pupils: Pupils are equal, round, and reactive to light.  Neck:     Vascular: No carotid bruit.  Cardiovascular:     Rate and Rhythm: Normal rate and regular rhythm.     Pulses: Normal pulses.     Heart sounds: Normal heart sounds.  Pulmonary:     Effort: Pulmonary effort is normal.     Breath sounds: Normal breath sounds.  Abdominal:     General: Bowel sounds are normal.     Palpations: Abdomen is soft. There is no hepatomegaly, splenomegaly or mass.     Tenderness:  There is no abdominal tenderness.     Hernia: No hernia is present.  Musculoskeletal:        General: No tenderness.     Cervical back: Neck supple.     Right lower leg: No edema.     Left lower leg: No edema.  Skin:    Findings: No rash.  Neurological:     Mental Status: She is alert and oriented to person, place, and time.     Motor: No weakness.  Psychiatric:        Mood and Affect: Mood and affect normal.        Behavior: Behavior normal.     BP 130/80   Pulse 82   Ht $R'5\' 8"'am$  (1.727 m)   Wt 193 lb 8 oz (87.8 kg)   BMI 29.42 kg/m  Wt Readings from Last 3 Encounters:  05/01/22 193 lb 8 oz (87.8 kg)  04/17/22 193 lb 8 oz (87.8 kg)  11/21/21 196 lb (88.9 kg)     Health Maintenance Due  Topic Date Due   PAP SMEAR-Modifier  Never done   COVID-19 Vaccine (4 - Booster for Janssen series) 04/04/2021    There are no preventive care reminders to display for this patient.  Lab Results  Component Value Date   TSH 1.45 11/23/2021   Lab Results  Component Value Date   WBC 6.5 11/23/2021   HGB 14.0 11/23/2021   HCT 42.2 11/23/2021   MCV 97.5 11/23/2021   PLT 330 11/23/2021   Lab Results  Component Value Date   NA 143 11/23/2021   K 4.3 11/23/2021   CO2 26 11/23/2021   GLUCOSE 97 11/23/2021   BUN 9 11/23/2021   CREATININE 0.89 11/23/2021   BILITOT 0.5 11/23/2021   AST 21 11/23/2021   ALT 26 11/23/2021   PROT 6.6 11/23/2021   CALCIUM 9.4 11/23/2021   EGFR 73 11/23/2021   Lab Results  Component Value Date   CHOL 248 (H) 03/20/2022   Lab Results  Component Value Date   HDL 54 03/20/2022   Lab Results  Component Value Date   LDLCALC 164 (H) 03/20/2022   Lab Results  Component Value Date   TRIG 157 (H) 03/20/2022   Lab Results  Component Value Date   CHOLHDL 4.6 03/20/2022   No results found for: "HGBA1C"    Assessment & Plan:   Problem List Items Addressed This Visit       Cardiovascular and Mediastinum   Supraventricular tachycardia ([place])  Stable at the present time        Musculoskeletal and Integument   Primary osteoarthritis of left knee    Stable at the present time        Other   Statin intolerance    Hypercholesterolemia  I advised the patient to follow Mediterranean diet This diet is rich in fruits vegetables and whole grain, and This diet is also rich in fish and lean meat Patient should also eat a handful of almonds or walnuts daily Recent heart study indicated that average follow-up on this kind of diet reduces the cardiovascular mortality by 50 to 70%==      Flank pain - Primary    Ultrasound of the kidney shows a small cyst no hydronephrosis was noted bladder is normally distended       No orders of the defined types were placed in this encounter. Repeat urine check unremarkable  Follow-up: No follow-ups on file.    Cletis Athens, MD

## 2022-05-08 ENCOUNTER — Other Ambulatory Visit: Payer: Self-pay

## 2022-05-08 MED ORDER — METOPROLOL SUCCINATE ER 25 MG PO TB24: 25.0000 mg | ORAL_TABLET | Freq: Every day | ORAL | 0 refills | Status: DC

## 2022-05-11 ENCOUNTER — Other Ambulatory Visit: Payer: Self-pay

## 2022-05-17 ENCOUNTER — Ambulatory Visit (INDEPENDENT_AMBULATORY_CARE_PROVIDER_SITE_OTHER): Payer: Managed Care, Other (non HMO)

## 2022-05-17 DIAGNOSIS — I471 Supraventricular tachycardia: Secondary | ICD-10-CM

## 2022-05-17 LAB — CUP PACEART REMOTE DEVICE CHECK
Battery Remaining Longevity: 152 mo
Battery Voltage: 3.04 V
Brady Statistic RV Percent Paced: 0.07 %
Date Time Interrogation Session: 20230711073943
HighPow Impedance: 40 Ohm
HighPow Impedance: 51 Ohm
Implantable Lead Implant Date: 20130501
Implantable Lead Location: 753860
Implantable Pulse Generator Implant Date: 20220322
Lead Channel Impedance Value: 513 Ohm
Lead Channel Impedance Value: 570 Ohm
Lead Channel Pacing Threshold Amplitude: 0.5 V
Lead Channel Pacing Threshold Pulse Width: 0.4 ms
Lead Channel Sensing Intrinsic Amplitude: 7 mV
Lead Channel Setting Pacing Amplitude: 1.5 V
Lead Channel Setting Pacing Pulse Width: 0.4 ms
Lead Channel Setting Sensing Sensitivity: 0.45 mV

## 2022-05-24 ENCOUNTER — Telehealth: Payer: Self-pay | Admitting: Cardiology

## 2022-05-24 ENCOUNTER — Other Ambulatory Visit: Payer: Self-pay

## 2022-05-24 MED ORDER — METOPROLOL SUCCINATE ER 25 MG PO TB24: 25.0000 mg | ORAL_TABLET | Freq: Every day | ORAL | 0 refills | Status: DC

## 2022-05-24 NOTE — Telephone Encounter (Signed)
Medication refilled and sent to desired pharmacy and notified patient

## 2022-05-24 NOTE — Telephone Encounter (Signed)
*  STAT* If patient is at the pharmacy, call can be transferred to refill team.   1. Which medications need to be refilled? (please list name of each medication and dose if known)  metoprolol succinate (TOPROL XL) 25 MG 24 hr tablet  2. Which pharmacy/location (including street and city if local pharmacy) is medication to be sent to? [facility] [place], [place]  3. Do they need a 30 day or 90 day supply? 30 with refills

## 2022-06-07 ENCOUNTER — Encounter: Payer: Self-pay | Admitting: Internal Medicine

## 2022-06-07 ENCOUNTER — Ambulatory Visit: Payer: Managed Care, Other (non HMO) | Admitting: Internal Medicine

## 2022-06-07 VITALS — BP 123/81 | HR 81 | Ht 68.0 in | Wt 191.8 lb

## 2022-06-07 DIAGNOSIS — Z0000 Encounter for general adult medical examination without abnormal findings: Secondary | ICD-10-CM

## 2022-06-07 DIAGNOSIS — M1712 Unilateral primary osteoarthritis, left knee: Secondary | ICD-10-CM | POA: Diagnosis not present

## 2022-06-07 DIAGNOSIS — I472 Ventricular tachycardia, unspecified: Secondary | ICD-10-CM

## 2022-06-07 DIAGNOSIS — E785 Hyperlipidemia, unspecified: Secondary | ICD-10-CM | POA: Diagnosis not present

## 2022-06-07 DIAGNOSIS — I471 Supraventricular tachycardia: Secondary | ICD-10-CM

## 2022-06-07 DIAGNOSIS — Z789 Other specified health status: Secondary | ICD-10-CM

## 2022-06-07 MED ORDER — REPATHA 140 MG/ML ~~LOC~~ SOSY: 140.0000 mg | PREFILLED_SYRINGE | SUBCUTANEOUS | 3 refills | Status: DC

## 2022-06-07 NOTE — Progress Notes (Signed)
Established Patient Office Visit  Subjective:  Patient ID: Christine Fitzpatrick, female    DOB: 04/28/1956  Age: 66 y.o. MRN: [id number]  CC:  Chief Complaint  Patient presents with   Medication Reaction    Patient reports complications with Crestor. Patient reports body aches, she has started taking the Crestor every other day with no relief.    HPI  Christine Fitzpatrick presents for  myalgia Past Medical History:  Diagnosis Date   Hyperlipidemia    Hypertension     Past Surgical History:  Procedure Laterality Date   BREAST EXCISIONAL BIOPSY     CARDIAC DEFIBRILLATOR PLACEMENT     CATARACT EXTRACTION Left    MIDDLE EAR SURGERY Left     Family History  Problem Relation Age of Onset   Hypertension Mother    Hypertension Maternal Aunt    Diabetes Maternal Grandmother     Social History   Socioeconomic History   Marital status: Married    Spouse name: Not on file   Number of children: Not on file   Years of education: Not on file   Highest education level: Not on file  Occupational History   Not on file  Tobacco Use   Smoking status: Never   Smokeless tobacco: Never  Substance and Sexual Activity   Alcohol use: Yes   Drug use: Never   Sexual activity: Not on file  Other Topics Concern   Not on file  Social History Narrative   Not on file   Social Determinants of Health   Financial Resource Strain: Not on file  Food Insecurity: Not on file  Transportation Needs: Not on file  Physical Activity: Not on file  Stress: Not on file  Social Connections: Not on file  Intimate Partner Violence: Not on file     Current Outpatient Medications:    Evolocumab (REPATHA) 140 MG/ML SOSY, Inject 140 mg into the skin every 14 (fourteen) days., Disp: 2.1 mL, Rfl: 3   Ascorbic Acid (VITAMIN C) 100 MG tablet, , Disp: , Rfl:    atorvastatin (LIPITOR) 10 MG tablet, Take 10 mg by mouth daily. (Patient not taking: Reported on 11/21/2021), Disp: , Rfl:    cephALEXin (KEFLEX) 500 MG  capsule, Take 1 capsule (500 mg total) by mouth 2 (two) times daily., Disp: 14 capsule, Rfl: 0   cetirizine (ZYRTEC) 10 MG tablet, Take 10 mg by mouth daily as needed for allergies., Disp: , Rfl:    Cholecalciferol (VITAMIN D3) 10 MCG (400 UNIT) tablet, Take by mouth., Disp: , Rfl:    Coenzyme Q10 100 MG capsule, Take 1 capsule by mouth 2 (two) times daily., Disp: , Rfl:    fluticasone (FLONASE) 50 MCG/ACT nasal spray, Place into both nostrils daily as needed for allergies or rhinitis., Disp: , Rfl:    meloxicam (MOBIC) 15 MG tablet, Take 15 mg by mouth daily., Disp: , Rfl:    methylPREDNISolone (MEDROL DOSEPAK) 4 MG TBPK tablet, Take by mouth., Disp: , Rfl:    metoprolol succinate (TOPROL XL) 25 MG 24 hr tablet, Take 1 tablet (25 mg total) by mouth daily., Disp: 90 tablet, Rfl: 0   Omega-3 Fatty Acids (OMEGA-3 2100) 1050 MG CAPS, Take by mouth., Disp: , Rfl:    No Known Allergies  ROS Review of Systems  Constitutional: Negative.   HENT: Negative.    Eyes: Negative.   Respiratory: Negative.    Cardiovascular: Negative.   Gastrointestinal: Negative.   Endocrine: Negative.   Genitourinary: Negative.  Musculoskeletal: Negative.   Skin: Negative.   Allergic/Immunologic: Negative.   Neurological: Negative.   Hematological: Negative.   Psychiatric/Behavioral: Negative.  Negative for agitation, behavioral problems and confusion.   All other systems reviewed and are negative.     Objective:    Physical Exam  BP 123/81   Pulse 81   Ht $R'5\' 8"'GR$  (1.727 m)   Wt 191 lb 12.8 oz (87 kg)   BMI 29.16 kg/m  Wt Readings from Last 3 Encounters:  06/07/22 191 lb 12.8 oz (87 kg)  05/01/22 193 lb 8 oz (87.8 kg)  04/17/22 193 lb 8 oz (87.8 kg)     Health Maintenance Due  Topic Date Due   PAP SMEAR-Modifier  Never done   COVID-19 Vaccine (4 - Booster for Janssen series) 04/04/2021   INFLUENZA VACCINE  06/06/2022    There are no preventive care reminders to display for this  patient.  Lab Results  Component Value Date   TSH 1.45 11/23/2021   Lab Results  Component Value Date   WBC 6.5 11/23/2021   HGB 14.0 11/23/2021   HCT 42.2 11/23/2021   MCV 97.5 11/23/2021   PLT 330 11/23/2021   Lab Results  Component Value Date   NA 143 11/23/2021   K 4.3 11/23/2021   CO2 26 11/23/2021   GLUCOSE 97 11/23/2021   BUN 9 11/23/2021   CREATININE 0.89 11/23/2021   BILITOT 0.5 11/23/2021   AST 21 11/23/2021   ALT 26 11/23/2021   PROT 6.6 11/23/2021   CALCIUM 9.4 11/23/2021   EGFR 73 11/23/2021   Lab Results  Component Value Date   CHOL 248 (H) 03/20/2022   Lab Results  Component Value Date   HDL 54 03/20/2022   Lab Results  Component Value Date   LDLCALC 164 (H) 03/20/2022   Lab Results  Component Value Date   TRIG 157 (H) 03/20/2022   Lab Results  Component Value Date   CHOLHDL 4.6 03/20/2022   No results found for: "HGBA1C"    Assessment & Plan:   Problem List Items Addressed This Visit       Cardiovascular and Mediastinum   Ventricular tachycardia ([place])    Patient is on metoprolol she has a defibrillator.      Relevant Medications   Evolocumab (REPATHA) 140 MG/ML SOSY   Supraventricular tachycardia ([name]) - Primary   Relevant Medications   Evolocumab (REPATHA) 140 MG/ML SOSY     Musculoskeletal and Integument   Primary osteoarthritis of left knee    Patient was advised to lose weight.        Other   Preventative health care    Patient was advised to lose weight and walk daily      Statin intolerance    Patient did not tolerate Crestor and Lipitor in the small dose.  It caused myalgia and severe muscle ache      Hyperlipidemia    Patient has not tolerated statin Lipitor and Crestor.  So we will try Repatha.  We will send the prescription To signa  for approval      Relevant Medications   Evolocumab (REPATHA) 140 MG/ML SOSY    Meds ordered this encounter  Medications   Evolocumab (REPATHA) 140 MG/ML SOSY    Sig:  Inject 140 mg into the skin every 14 (fourteen) days.    Dispense:  2.1 mL    Refill:  3    Follow-up: No follow-ups on file.    Cletis Athens, MD

## 2022-06-07 NOTE — Assessment & Plan Note (Signed)
Patient has not tolerated statin Lipitor and Crestor.  So we will try Repatha.  We will send the prescription To signa  for approval

## 2022-06-07 NOTE — Assessment & Plan Note (Signed)
>>  ASSESSMENT AND PLAN FOR VENTRICULAR TACHYCARDIA ([name]) WRITTEN ON 06/07/2022 10:51 AM BY MASOUD, JAVED, MD  Patient is on metoprolol  she has a defibrillator.

## 2022-06-07 NOTE — Assessment & Plan Note (Signed)
Patient is on metoprolol she has a defibrillator.

## 2022-06-07 NOTE — Assessment & Plan Note (Signed)
Patient was advised to lose weight 

## 2022-06-07 NOTE — Progress Notes (Signed)
Remote ICD transmission.   

## 2022-06-07 NOTE — Assessment & Plan Note (Signed)
Patient did not tolerate Crestor and Lipitor in the small dose.  It caused myalgia and severe muscle ache

## 2022-06-07 NOTE — Assessment & Plan Note (Signed)
Patient was advised to lose weight and walk daily

## 2022-06-29 ENCOUNTER — Ambulatory Visit: Payer: Managed Care, Other (non HMO) | Admitting: *Deleted

## 2022-06-29 DIAGNOSIS — E785 Hyperlipidemia, unspecified: Secondary | ICD-10-CM

## 2022-06-29 DIAGNOSIS — Z1231 Encounter for screening mammogram for malignant neoplasm of breast: Secondary | ICD-10-CM

## 2022-06-29 NOTE — Progress Notes (Signed)
Patient provided on Repatha. Patient was injected with her first dose of Repatha '140mg'$ . I provided instructions for patient to administer her own injections every 14 days.

## 2022-07-04 NOTE — Progress Notes (Unsigned)
Electrophysiology Office Follow up Visit Note:    Date:  07/05/2022   ID:  Christine Fitzpatrick, DOB Apr 28, 1956, MRN [id number]  PCP:  Cletis Athens, MD  [facility] Cardiologist:  None  [facility] Electrophysiologist:  Vickie Epley, MD    Interval History:    Christine Fitzpatrick is a 66 y.o. female who presents for a follow up visit. They were last seen in clinic July 06, 2021.  Her previous appointment with me was for history of SVT and ventricular tachycardia.  She is on metoprolol for her SVT and has an ICD for her history of VT.  She is done well with her ICD.  Incision is healed well.  Her SVT episodes are getting more frequent and more symptomatic.  Episodes now are lasting greater than 10 minutes.  She feels palpitations during the episodes and sometimes feels lightheaded.  No syncope.     Past Medical History:  Diagnosis Date   Hyperlipidemia    Hypertension     Past Surgical History:  Procedure Laterality Date   BREAST EXCISIONAL BIOPSY     CARDIAC DEFIBRILLATOR PLACEMENT     CATARACT EXTRACTION Left    MIDDLE EAR SURGERY Left     Current Medications: Current Meds  Medication Sig   Ascorbic Acid (VITAMIN C) 100 MG tablet    CALCIUM PO Take by mouth. 400 mg daily calcium with vit b12   cetirizine (ZYRTEC) 10 MG tablet Take 10 mg by mouth daily as needed for allergies.   Cholecalciferol (VITAMIN D3) 10 MCG (400 UNIT) tablet Take by mouth.   Coenzyme Q10 100 MG capsule Take 1 capsule by mouth 2 (two) times daily.   Evolocumab (REPATHA) 140 MG/ML SOSY Inject 140 mg into the skin every 14 (fourteen) days.   fluticasone (FLONASE) 50 MCG/ACT nasal spray Place into both nostrils daily as needed for allergies or rhinitis.   methylPREDNISolone (MEDROL DOSEPAK) 4 MG TBPK tablet Take by mouth.   metoprolol succinate (TOPROL XL) 25 MG 24 hr tablet Take 1 tablet (25 mg total) by mouth daily.   Omega-3 Fatty Acids (OMEGA-3 2100) 1050 MG CAPS Take by mouth.     Allergies:    Patient has no known allergies.   Social History   Socioeconomic History   Marital status: Married    Spouse name: Not on file   Number of children: Not on file   Years of education: Not on file   Highest education level: Not on file  Occupational History   Not on file  Tobacco Use   Smoking status: Never   Smokeless tobacco: Never  Substance and Sexual Activity   Alcohol use: Yes   Drug use: Never   Sexual activity: Not on file  Other Topics Concern   Not on file  Social History Narrative   Not on file   Social Determinants of Health   Financial Resource Strain: Not on file  Food Insecurity: Not on file  Transportation Needs: Not on file  Physical Activity: Not on file  Stress: Not on file  Social Connections: Not on file     Family History: The patient's family history includes Diabetes in her maternal grandmother; Hypertension in her maternal aunt and mother.  ROS:   Please see the history of present illness.    All other systems reviewed and are negative.  EKGs/Labs/Other Studies Reviewed:    The following studies were reviewed today:  July 05, 2022 in clinic device interrogation personally reviewed Battery longevity okay.  SVT episodes reviewed.  Lead parameters stable.  No programming changes made today.  Sinus rhythm.  PVC.  Recent Labs: 11/23/2021: ALT 26; BUN 9; Creat 0.89; Hemoglobin 14.0; Platelets 330; Potassium 4.3; Sodium 143; TSH 1.45  Recent Lipid Panel    Component Value Date/Time   CHOL 248 (H) 03/20/2022 0902   TRIG 157 (H) 03/20/2022 0902   HDL 54 03/20/2022 0902   CHOLHDL 4.6 03/20/2022 0902   LDLCALC 164 (H) 03/20/2022 0902    Physical Exam:    VS:  BP 112/76   Pulse 83   Ht '5\' 8"'$  (1.727 m)   Wt 194 lb 6.4 oz (88.2 kg)   SpO2 96%   BMI 29.56 kg/m     Wt Readings from Last 3 Encounters:  07/05/22 194 lb 6.4 oz (88.2 kg)  06/07/22 191 lb 12.8 oz (87 kg)  05/01/22 193 lb 8 oz (87.8 kg)     GEN:  Well nourished, well  developed in no acute distress HEENT: Normal NECK: No JVD; No carotid bruits LYMPHATICS: No lymphadenopathy CARDIAC: RRR, no murmurs, rubs, gallops.  ICD pocket well-healed RESPIRATORY:  Clear to auscultation without rales, wheezing or rhonchi  ABDOMEN: Soft, non-tender, non-distended MUSCULOSKELETAL:  No edema; No deformity  SKIN: Warm and dry NEUROLOGIC:  Alert and oriented x 3 PSYCHIATRIC:  Normal affect        ASSESSMENT:    1. Ventricular tachycardia ([place])   2. SVT (supraventricular tachycardia) ([place])   3. Implantable cardioverter-defibrillator (ICD) in situ    PLAN:    In order of problems listed above:  #SVT More frequent, longer episodes.  Symptomatic.  Discussed treatment options again today and she would like to proceed with EP study and ablation.  She will hold her metoprolol for 5 days prior to the ablation.  Therapeutic strategies for supraventricular tachycardia including medicine and ablation were discussed in detail with the patient today. Risk, benefits, and alternatives to EP study and radiofrequency ablation were also discussed in detail today. These risks include but are not limited to stroke, bleeding, vascular damage, tamponade, perforation, damage to the heart and other structures, AV block requiring pacemaker, worsening renal function, and death. The patient understands these risk and wishes to proceed.  We will therefore proceed with catheter ablation at the next available time.  #VT #ICD in situ Device functioning appropriately.  Continue remote monitoring.    Medication Adjustments/Labs and Tests Ordered: Current medicines are reviewed at length with the patient today.  Concerns regarding medicines are outlined above.  No orders of the defined types were placed in this encounter.  No orders of the defined types were placed in this encounter.    Signed, Lars Mage, MD, [facility], [facility] 07/05/2022 8:24 AM    Electrophysiology [facility]

## 2022-07-04 NOTE — H&P (View-Only) (Signed)
Electrophysiology Office Follow up Visit Note:    Date:  07/05/2022   ID:  Christine Fitzpatrick, DOB 11-Mar-1956, MRN [id number]  PCP:  Cletis Athens, MD  [facility] Cardiologist:  None  [facility] Electrophysiologist:  Vickie Epley, MD    Interval History:    Christine Fitzpatrick is a 66 y.o. female who presents for a follow up visit. They were last seen in clinic July 06, 2021.  Her previous appointment with me was for history of SVT and ventricular tachycardia.  She is on metoprolol for her SVT and has an ICD for her history of VT.  She is done well with her ICD.  Incision is healed well.  Her SVT episodes are getting more frequent and more symptomatic.  Episodes now are lasting greater than 10 minutes.  She feels palpitations during the episodes and sometimes feels lightheaded.  No syncope.     Past Medical History:  Diagnosis Date   Hyperlipidemia    Hypertension     Past Surgical History:  Procedure Laterality Date   BREAST EXCISIONAL BIOPSY     CARDIAC DEFIBRILLATOR PLACEMENT     CATARACT EXTRACTION Left    MIDDLE EAR SURGERY Left     Current Medications: Current Meds  Medication Sig   Ascorbic Acid (VITAMIN C) 100 MG tablet    CALCIUM PO Take by mouth. 400 mg daily calcium with vit b12   cetirizine (ZYRTEC) 10 MG tablet Take 10 mg by mouth daily as needed for allergies.   Cholecalciferol (VITAMIN D3) 10 MCG (400 UNIT) tablet Take by mouth.   Coenzyme Q10 100 MG capsule Take 1 capsule by mouth 2 (two) times daily.   Evolocumab (REPATHA) 140 MG/ML SOSY Inject 140 mg into the skin every 14 (fourteen) days.   fluticasone (FLONASE) 50 MCG/ACT nasal spray Place into both nostrils daily as needed for allergies or rhinitis.   methylPREDNISolone (MEDROL DOSEPAK) 4 MG TBPK tablet Take by mouth.   metoprolol succinate (TOPROL XL) 25 MG 24 hr tablet Take 1 tablet (25 mg total) by mouth daily.   Omega-3 Fatty Acids (OMEGA-3 2100) 1050 MG CAPS Take by mouth.     Allergies:    Patient has no known allergies.   Social History   Socioeconomic History   Marital status: Married    Spouse name: Not on file   Number of children: Not on file   Years of education: Not on file   Highest education level: Not on file  Occupational History   Not on file  Tobacco Use   Smoking status: Never   Smokeless tobacco: Never  Substance and Sexual Activity   Alcohol use: Yes   Drug use: Never   Sexual activity: Not on file  Other Topics Concern   Not on file  Social History Narrative   Not on file   Social Determinants of Health   Financial Resource Strain: Not on file  Food Insecurity: Not on file  Transportation Needs: Not on file  Physical Activity: Not on file  Stress: Not on file  Social Connections: Not on file     Family History: The patient's family history includes Diabetes in her maternal grandmother; Hypertension in her maternal aunt and mother.  ROS:   Please see the history of present illness.    All other systems reviewed and are negative.  EKGs/Labs/Other Studies Reviewed:    The following studies were reviewed today:  July 05, 2022 in clinic device interrogation personally reviewed Battery longevity okay.  SVT episodes reviewed.  Lead parameters stable.  No programming changes made today.  Sinus rhythm.  PVC.  Recent Labs: 11/23/2021: ALT 26; BUN 9; Creat 0.89; Hemoglobin 14.0; Platelets 330; Potassium 4.3; Sodium 143; TSH 1.45  Recent Lipid Panel    Component Value Date/Time   CHOL 248 (H) 03/20/2022 0902   TRIG 157 (H) 03/20/2022 0902   HDL 54 03/20/2022 0902   CHOLHDL 4.6 03/20/2022 0902   LDLCALC 164 (H) 03/20/2022 0902    Physical Exam:    VS:  BP 112/76   Pulse 83   Ht '5\' 8"'$  (1.727 m)   Wt 194 lb 6.4 oz (88.2 kg)   SpO2 96%   BMI 29.56 kg/m     Wt Readings from Last 3 Encounters:  07/05/22 194 lb 6.4 oz (88.2 kg)  06/07/22 191 lb 12.8 oz (87 kg)  05/01/22 193 lb 8 oz (87.8 kg)     GEN:  Well nourished, well  developed in no acute distress HEENT: Normal NECK: No JVD; No carotid bruits LYMPHATICS: No lymphadenopathy CARDIAC: RRR, no murmurs, rubs, gallops.  ICD pocket well-healed RESPIRATORY:  Clear to auscultation without rales, wheezing or rhonchi  ABDOMEN: Soft, non-tender, non-distended MUSCULOSKELETAL:  No edema; No deformity  SKIN: Warm and dry NEUROLOGIC:  Alert and oriented x 3 PSYCHIATRIC:  Normal affect        ASSESSMENT:    1. Ventricular tachycardia ([place])   2. SVT (supraventricular tachycardia) ([place])   3. Implantable cardioverter-defibrillator (ICD) in situ    PLAN:    In order of problems listed above:  #SVT More frequent, longer episodes.  Symptomatic.  Discussed treatment options again today and she would like to proceed with EP study and ablation.  She will hold her metoprolol for 5 days prior to the ablation.  Therapeutic strategies for supraventricular tachycardia including medicine and ablation were discussed in detail with the patient today. Risk, benefits, and alternatives to EP study and radiofrequency ablation were also discussed in detail today. These risks include but are not limited to stroke, bleeding, vascular damage, tamponade, perforation, damage to the heart and other structures, AV block requiring pacemaker, worsening renal function, and death. The patient understands these risk and wishes to proceed.  We will therefore proceed with catheter ablation at the next available time.  #VT #ICD in situ Device functioning appropriately.  Continue remote monitoring.    Medication Adjustments/Labs and Tests Ordered: Current medicines are reviewed at length with the patient today.  Concerns regarding medicines are outlined above.  No orders of the defined types were placed in this encounter.  No orders of the defined types were placed in this encounter.    Signed, Lars Mage, MD, [facility], [facility] 07/05/2022 8:24 AM    Electrophysiology [facility]

## 2022-07-05 ENCOUNTER — Encounter: Payer: Self-pay | Admitting: *Deleted

## 2022-07-05 ENCOUNTER — Encounter: Payer: Self-pay | Admitting: Podiatry

## 2022-07-05 ENCOUNTER — Encounter: Payer: Self-pay | Admitting: Cardiology

## 2022-07-05 ENCOUNTER — Other Ambulatory Visit
Admission: RE | Admit: 2022-07-05 | Discharge: 2022-07-05 | Disposition: A | Discharge status: Home / Self Care | Payer: Managed Care, Other (non HMO) | Source: Ambulatory Visit | Attending: Cardiology | Admitting: Cardiology

## 2022-07-05 ENCOUNTER — Ambulatory Visit (INDEPENDENT_AMBULATORY_CARE_PROVIDER_SITE_OTHER): Payer: Managed Care, Other (non HMO) | Admitting: Podiatry

## 2022-07-05 ENCOUNTER — Ambulatory Visit: Payer: Managed Care, Other (non HMO) | Attending: Cardiology | Admitting: Cardiology

## 2022-07-05 VITALS — BP 112/76 | HR 83 | Ht 68.0 in | Wt 194.4 lb

## 2022-07-05 DIAGNOSIS — B351 Tinea unguium: Secondary | ICD-10-CM

## 2022-07-05 DIAGNOSIS — Z95810 Presence of automatic (implantable) cardiac defibrillator: Secondary | ICD-10-CM | POA: Diagnosis not present

## 2022-07-05 DIAGNOSIS — M2012 Hallux valgus (acquired), left foot: Secondary | ICD-10-CM

## 2022-07-05 DIAGNOSIS — M21612 Bunion of left foot: Secondary | ICD-10-CM | POA: Diagnosis not present

## 2022-07-05 DIAGNOSIS — I471 Supraventricular tachycardia: Secondary | ICD-10-CM | POA: Diagnosis not present

## 2022-07-05 DIAGNOSIS — M2011 Hallux valgus (acquired), right foot: Secondary | ICD-10-CM | POA: Diagnosis not present

## 2022-07-05 DIAGNOSIS — Z01818 Encounter for other preprocedural examination: Secondary | ICD-10-CM

## 2022-07-05 DIAGNOSIS — K2970 Gastritis, unspecified, without bleeding: Secondary | ICD-10-CM | POA: Insufficient documentation

## 2022-07-05 DIAGNOSIS — I472 Ventricular tachycardia, unspecified: Secondary | ICD-10-CM

## 2022-07-05 DIAGNOSIS — M21611 Bunion of right foot: Secondary | ICD-10-CM

## 2022-07-05 DIAGNOSIS — R29898 Other symptoms and signs involving the musculoskeletal system: Secondary | ICD-10-CM | POA: Insufficient documentation

## 2022-07-05 DIAGNOSIS — M170 Bilateral primary osteoarthritis of knee: Secondary | ICD-10-CM | POA: Insufficient documentation

## 2022-07-05 LAB — BASIC METABOLIC PANEL
Anion gap: 5 (ref 5–15)
BUN: 12 mg/dL (ref 8–23)
CO2: 25 mmol/L (ref 22–32)
Calcium: 9.5 mg/dL (ref 8.9–10.3)
Chloride: 109 mmol/L (ref 98–111)
Creatinine, Ser: 0.88 mg/dL (ref 0.44–1.00)
GFR, Estimated: >60 mL/min (ref >60)
Glucose, Bld: 117 mg/dL — ABNORMAL HIGH (ref 70–99)
Potassium: 4.3 mmol/L (ref 3.5–5.1)
Sodium: 139 mmol/L (ref 135–145)

## 2022-07-05 LAB — CBC WITH DIFFERENTIAL/PLATELET
Abs Immature Granulocytes: 0.03 10*3/uL (ref 0.00–0.07)
Basophils Absolute: 0.1 10*3/uL (ref 0.0–0.1)
Basophils Relative: 1 %
Eosinophils Absolute: 0.2 10*3/uL (ref 0.0–0.5)
Eosinophils Relative: 4 %
HCT: 44.9 % (ref 36.0–46.0)
Hemoglobin: 14.4 g/dL (ref 12.0–15.0)
Immature Granulocytes: 1 %
Lymphocytes Relative: 32 %
Lymphs Abs: 1.9 10*3/uL (ref 0.7–4.0)
MCH: 30.4 pg (ref 26.0–34.0)
MCHC: 32.1 g/dL (ref 30.0–36.0)
MCV: 94.9 fL (ref 80.0–100.0)
Monocytes Absolute: 0.5 10*3/uL (ref 0.1–1.0)
Monocytes Relative: 8 %
Neutro Abs: 3.3 10*3/uL (ref 1.7–7.7)
Neutrophils Relative %: 54 %
Platelets: 299 10*3/uL (ref 150–400)
RBC: 4.73 MIL/uL (ref 3.87–5.11)
RDW: 12.4 % (ref 11.5–15.5)
WBC: 6 10*3/uL (ref 4.0–10.5)
nRBC: 0 % (ref 0.0–0.2)

## 2022-07-05 MED ORDER — FLUCONAZOLE 150 MG PO TABS: 150.0000 mg | ORAL_TABLET | ORAL | 0 refills | Status: DC

## 2022-07-05 NOTE — Progress Notes (Signed)
  Subjective:  Patient ID: Christine Fitzpatrick, female    DOB: 1956/06/17,  MRN: [id number]  Chief Complaint  Patient presents with   Nail Problem       (np) left foot great toe-nail funus, nail lifting, crumbling. Patient tried terbinafine when she lived in [place], but it did not help much. She calls it her "creepy" toenail    66 y.o. female presents with the above complaint. History confirmed with patient.  She also has bunions on both feet  Objective:  Physical Exam: warm, good capillary refill, no trophic changes or ulcerative lesions, normal DP and PT pulses, and normal sensory exam. Left Foot: bunion deformity noted and severe onychomycosis left hallux Right Foot: Bunion deformity noted and onychomycosis right hallux noted mild    Assessment:   1. Onychomycosis   2. Hallux valgus with bunions, left   3. Hallux valgus with bunions, right      Plan:  Patient was evaluated and treated and all questions answered.  We reviewed the etiology and treatment options of onychomycosis in detail we discussed topical oral and laser treatment.  Discussed that she did not have improvement previously with oral therapy she took terbinafine for 3 months and did not see a difference.  We discussed the possible she had terbinafine resistance and so I recommended a culture of the nail plate today this was sent to [facility] pathology I will discuss with her the results.  I recommended pulsed dosing with fluconazole as well and sent this to her pharmacy.  If this is unsuccessful may consider temporary avulsion of the nail plate and/or laser therapy.  She also has bunions that are beginning to bother her more and more.  She has smooth pain-free range of motion and so I do not think she has arthritis in the toes.  She is interested in surgical correction at some point.  She will have x-rays taken at the next visit we will discuss surgical correction of these when she is ready to proceed with this.  Return in about  4 months (around 11/04/2022) for follow up after nail fungus treatment; Xray both feet for bunion consult .

## 2022-07-05 NOTE — Addendum Note (Signed)
Addended by: Wardell Heath on: 07/05/2022 04:23 PM   Modules accepted: Orders

## 2022-07-05 NOTE — Addendum Note (Signed)
Addended by: Darrell Jewel on: 07/05/2022 08:45 AM   Modules accepted: Orders

## 2022-07-05 NOTE — Patient Instructions (Signed)
Medication Instructions:  none *If you need a refill on your cardiac medications before your next appointment, please call your pharmacy*   Lab Work: CBC, BMP  If you have labs (blood work) drawn today and your tests are completely normal, you will receive your results only by: MyChart Message (if you have MyChart) OR A paper copy in the mail If you have any lab test that is abnormal or we need to change your treatment, we will call you to review the results.   Testing/Procedures: Your physician has recommended that you have an ablation. Catheter ablation is a medical procedure used to treat some cardiac arrhythmias (irregular heartbeats). During catheter ablation, a long, thin, flexible tube is put into a blood vessel in your groin (upper thigh), or neck. This tube is called an ablation catheter. It is then guided to your heart through the blood vessel. Radio frequency waves destroy small areas of heart tissue where abnormal heartbeats may cause an arrhythmia to start. Please see the instruction sheet given to you today.   Follow-Up: At [facility], you and your health needs are our priority.  As part of our continuing mission to provide you with exceptional heart care, we have created designated Provider Care Teams.  These Care Teams include your primary Cardiologist (physician) and Advanced Practice Providers (APPs -  Physician Assistants and Nurse Practitioners) who all work together to provide you with the care you need, when you need it.  We recommend signing up for the patient portal called "MyChart".  Sign up information is provided on this After Visit Summary.  MyChart is used to connect with patients for Virtual Visits (Telemedicine).  Patients are able to view lab/test results, encounter notes, upcoming appointments, etc.  Non-urgent messages can be sent to your provider as well.   To learn more about what you can do with MyChart, go to [email or web address].    Your next  appointment:   See instruction letter.  Important Information About Sugar       

## 2022-07-06 ENCOUNTER — Encounter: Payer: Self-pay | Admitting: Podiatry

## 2022-07-17 ENCOUNTER — Telehealth: Payer: Self-pay | Admitting: Cardiology

## 2022-07-17 NOTE — Telephone Encounter (Signed)
Patient is calling to follow-up on why her post-op appointment was canceled and would it need to be rescheduled.

## 2022-07-17 NOTE — Pre-Procedure Instructions (Signed)
Instructed patient on the following items: Arrival time 1130 Nothing to eat or drink after midnight No meds AM of procedure Responsible person to drive you home and stay with you for 24 hr

## 2022-07-18 ENCOUNTER — Ambulatory Visit (HOSPITAL_COMMUNITY)
Admission: RE | Disposition: A | Discharge status: Home / Self Care | Payer: Managed Care, Other (non HMO) | Source: Home / Self Care | Attending: Cardiology

## 2022-07-18 ENCOUNTER — Encounter (HOSPITAL_COMMUNITY): Payer: Self-pay | Admitting: Cardiology

## 2022-07-18 ENCOUNTER — Ambulatory Visit (HOSPITAL_COMMUNITY): Payer: Managed Care, Other (non HMO) | Admitting: Anesthesiology

## 2022-07-18 ENCOUNTER — Ambulatory Visit (HOSPITAL_BASED_OUTPATIENT_CLINIC_OR_DEPARTMENT_OTHER): Payer: Managed Care, Other (non HMO) | Admitting: Anesthesiology

## 2022-07-18 ENCOUNTER — Other Ambulatory Visit: Payer: Self-pay

## 2022-07-18 ENCOUNTER — Ambulatory Visit (HOSPITAL_COMMUNITY)
Admission: RE | Admit: 2022-07-18 | Discharge: 2022-07-18 | Disposition: A | Discharge status: Home / Self Care | Payer: Managed Care, Other (non HMO) | Attending: Cardiology | Admitting: Cardiology

## 2022-07-18 ENCOUNTER — Other Ambulatory Visit: Payer: Self-pay | Admitting: Physician Assistant

## 2022-07-18 DIAGNOSIS — I471 Supraventricular tachycardia: Secondary | ICD-10-CM

## 2022-07-18 DIAGNOSIS — I10 Essential (primary) hypertension: Secondary | ICD-10-CM

## 2022-07-18 DIAGNOSIS — Z95810 Presence of automatic (implantable) cardiac defibrillator: Secondary | ICD-10-CM | POA: Insufficient documentation

## 2022-07-18 DIAGNOSIS — Z79899 Other long term (current) drug therapy: Secondary | ICD-10-CM | POA: Diagnosis not present

## 2022-07-18 DIAGNOSIS — I472 Ventricular tachycardia, unspecified: Secondary | ICD-10-CM | POA: Diagnosis not present

## 2022-07-18 DIAGNOSIS — M1990 Unspecified osteoarthritis, unspecified site: Secondary | ICD-10-CM

## 2022-07-18 HISTORY — PX: SVT ABLATION: EP1225

## 2022-07-18 LAB — NO BLOOD PRODUCTS

## 2022-07-18 SURGERY — SVT ABLATION
Anesthesia: General

## 2022-07-18 MED ORDER — PROPOFOL 500 MG/50ML IV EMUL
INTRAVENOUS | Status: DC | PRN
  Administered 2022-07-18: 125 ug/kg/min via INTRAVENOUS

## 2022-07-18 MED ORDER — ISOPROTERENOL HCL 0.2 MG/ML IJ SOLN
INTRAMUSCULAR | Status: AC
  Filled 2022-07-18: qty 5

## 2022-07-18 MED ORDER — BUPIVACAINE HCL (PF) 0.25 % IJ SOLN
INTRAMUSCULAR | Status: DC | PRN
  Administered 2022-07-18: 30 mL

## 2022-07-18 MED ORDER — FENTANYL CITRATE (PF) 100 MCG/2ML IJ SOLN
INTRAMUSCULAR | Status: AC
  Filled 2022-07-18: qty 2

## 2022-07-18 MED ORDER — PHENYLEPHRINE 80 MCG/ML (10ML) SYRINGE FOR IV PUSH (FOR BLOOD PRESSURE SUPPORT)
PREFILLED_SYRINGE | INTRAVENOUS | Status: DC | PRN
  Administered 2022-07-18: 80 ug via INTRAVENOUS
  Administered 2022-07-18: 240 ug via INTRAVENOUS
  Administered 2022-07-18: 80 ug via INTRAVENOUS

## 2022-07-18 MED ORDER — SODIUM CHLORIDE 0.9% FLUSH: 3.0000 mL | INTRAVENOUS | Status: DC | PRN

## 2022-07-18 MED ORDER — ONDANSETRON HCL 4 MG/2ML IJ SOLN: 4.0000 mg | Freq: Four times a day (QID) | INTRAMUSCULAR | Status: DC | PRN

## 2022-07-18 MED ORDER — MIDAZOLAM HCL 5 MG/5ML IJ SOLN
INTRAMUSCULAR | Status: AC
  Filled 2022-07-18: qty 5

## 2022-07-18 MED ORDER — PHENYLEPHRINE HCL-NACL 20-0.9 MG/250ML-% IV SOLN
INTRAVENOUS | Status: DC | PRN
  Administered 2022-07-18: 20 ug/min via INTRAVENOUS

## 2022-07-18 MED ORDER — CEFAZOLIN SODIUM-DEXTROSE 2-3 GM-%(50ML) IV SOLR
INTRAVENOUS | Status: DC | PRN
  Administered 2022-07-18: 2 g via INTRAVENOUS

## 2022-07-18 MED ORDER — HEPARIN (PORCINE) IN NACL 1000-0.9 UT/500ML-% IV SOLN
INTRAVENOUS | Status: DC | PRN
  Administered 2022-07-18: 500 mL

## 2022-07-18 MED ORDER — BUPIVACAINE HCL (PF) 0.25 % IJ SOLN
INTRAMUSCULAR | Status: AC
  Filled 2022-07-18: qty 30

## 2022-07-18 MED ORDER — ISOPROTERENOL HCL 0.2 MG/ML IJ SOLN
INTRAVENOUS | Status: DC | PRN
  Administered 2022-07-18: 1 ug/min via INTRAVENOUS

## 2022-07-18 MED ORDER — MIDAZOLAM HCL 2 MG/2ML IJ SOLN
INTRAMUSCULAR | Status: DC | PRN
  Administered 2022-07-18 (×2): 1 mg via INTRAVENOUS

## 2022-07-18 MED ORDER — CEFAZOLIN SODIUM-DEXTROSE 2-4 GM/100ML-% IV SOLN
INTRAVENOUS | Status: AC
  Filled 2022-07-18: qty 100

## 2022-07-18 MED ORDER — SODIUM CHLORIDE 0.9% FLUSH: 3.0000 mL | Freq: Two times a day (BID) | INTRAVENOUS | Status: DC

## 2022-07-18 MED ORDER — ACETAMINOPHEN 325 MG PO TABS: 650.0000 mg | ORAL_TABLET | ORAL | Status: DC | PRN

## 2022-07-18 MED ORDER — FENTANYL CITRATE (PF) 100 MCG/2ML IJ SOLN
INTRAMUSCULAR | Status: DC | PRN
  Administered 2022-07-18: 50 ug via INTRAVENOUS
  Administered 2022-07-18: 25 ug via INTRAVENOUS

## 2022-07-18 MED ORDER — ONDANSETRON HCL 4 MG/2ML IJ SOLN
INTRAMUSCULAR | Status: DC | PRN
  Administered 2022-07-18: 4 mg via INTRAVENOUS

## 2022-07-18 MED ORDER — SODIUM CHLORIDE 0.9 % IV SOLN: 250.0000 mL | INTRAVENOUS | Status: DC | PRN

## 2022-07-18 MED ORDER — HEPARIN SODIUM (PORCINE) 1000 UNIT/ML IJ SOLN
INTRAMUSCULAR | Status: AC
  Filled 2022-07-18: qty 10

## 2022-07-18 MED ORDER — SODIUM CHLORIDE 0.9 % IV SOLN: INTRAVENOUS | Status: DC

## 2022-07-18 SURGICAL SUPPLY — 11 items
CATH CRD2 QUAD 6FR REP (CATHETERS) IMPLANT
CATH DECANAV F CURVE (CATHETERS) IMPLANT
CATH JOSEPH QUAD ALLRED 6F REP (CATHETERS) IMPLANT
DEVICE CLOSURE MYNXGRIP 6/7F (Vascular Products) IMPLANT
PACK EP LATEX FREE (CUSTOM PROCEDURE TRAY) ×1
PACK EP LF (CUSTOM PROCEDURE TRAY) ×1 IMPLANT
PAD DEFIB RADIO PHYSIO CONN (PAD) ×1 IMPLANT
PATCH CARTO3 (PAD) IMPLANT
SHEATH PINNACLE 7F 10CM (SHEATH) IMPLANT
SHEATH PINNACLE 8F 10CM (SHEATH) IMPLANT
SHEATH PROBE COVER 6X72 (BAG) IMPLANT

## 2022-07-18 NOTE — Interval H&P Note (Signed)
History and Physical Interval Note:  07/18/2022 12:41 PM  Christine Fitzpatrick  has presented today for surgery, with the diagnosis of svt.  The various methods of treatment have been discussed with the patient and family. After consideration of risks, benefits and other options for treatment, the patient has consented to  Procedure(s): SVT ABLATION (N/A) as a surgical intervention.  The patient's history has been reviewed, patient examined, no change in status, stable for surgery.  I have reviewed the patient's chart and labs.  Questions were answered to the patient's satisfaction.     Carlia Bomkamp T Eaton Folmar

## 2022-07-18 NOTE — Anesthesia Preprocedure Evaluation (Addendum)
Anesthesia Evaluation  Patient identified by MRN, date of birth, ID band Patient awake    Reviewed: Allergy & Precautions, NPO status , Patient's Chart, lab work & pertinent test results  Airway Mallampati: II  TM Distance: >3 FB Neck ROM: Full    Dental no notable dental hx.    Pulmonary neg pulmonary ROS,    Pulmonary exam normal        Cardiovascular hypertension, Pt. on medications and Pt. on home beta blockers + dysrhythmias Supra Ventricular Tachycardia + Cardiac Defibrillator  Rhythm:Regular Rate:Normal     Neuro/Psych negative neurological ROS  negative psych ROS   GI/Hepatic negative GI ROS, Neg liver ROS,   Endo/Other  negative endocrine ROS  Renal/GU   negative genitourinary   Musculoskeletal  (+) Arthritis , Osteoarthritis,    Abdominal Normal abdominal exam  (+)   Peds  Hematology negative hematology ROS (+)   Anesthesia Other Findings   Reproductive/Obstetrics                           Anesthesia Physical Anesthesia Plan  ASA: 3  Anesthesia Plan: MAC   Post-op Pain Management:    Induction: Intravenous  PONV Risk Score and Plan: 2 and Ondansetron, Dexamethasone, Treatment may vary due to age or medical condition, Midazolam and Propofol infusion  Airway Management Planned: Simple Face Mask, Natural Airway and Nasal Cannula  Additional Equipment: None  Intra-op Plan:   Post-operative Plan:   Informed Consent: I have reviewed the patients History and Physical, chart, labs and discussed the procedure including the risks, benefits and alternatives for the proposed anesthesia with the patient or authorized representative who has indicated his/her understanding and acceptance.     Dental advisory given  Plan Discussed with: CRNA  Anesthesia Plan Comments: (Lab Results      Component                Value               Date                      WBC                       6.0                 07/05/2022                HGB                      14.4                07/05/2022                HCT                      44.9                07/05/2022                MCV                      94.9                07/05/2022                PLT  299                 07/05/2022           Lab Results      Component                Value               Date                      NA                       139                 07/05/2022                K                        4.3                 07/05/2022                CO2                      25                  07/05/2022                GLUCOSE                  117 (H)             07/05/2022                BUN                      12                  07/05/2022                CREATININE               0.88                07/05/2022                CALCIUM                  9.5                 07/05/2022                EGFR                     73                  11/23/2021                GFRNONAA                 >60                 07/05/2022          )       Anesthesia Quick Evaluation

## 2022-07-18 NOTE — Transfer of Care (Signed)
Immediate Anesthesia Transfer of Care Note  Patient: Christine Fitzpatrick  Procedure(s) Performed: SVT ABLATION  Patient Location: PACU  Anesthesia Type:MAC  Level of Consciousness: awake  Airway & Oxygen Therapy: Patient Spontanous Breathing  Post-op Assessment: Report given to RN and Post -op Vital signs reviewed and stable  Post vital signs: Reviewed and stable  Last Vitals:  Vitals Value Taken Time  BP    Temp    Pulse    Resp    SpO2      Last Pain:  Vitals:   07/18/22 1315  TempSrc:   PainSc: 0-No pain         Complications: There were no known notable events for this encounter.

## 2022-07-18 NOTE — Discharge Instructions (Signed)

## 2022-07-18 NOTE — Anesthesia Procedure Notes (Addendum)
Procedure Name: General with mask airway Date/Time: 07/18/2022 2:10 PM  Performed by: Erick Colace, CRNAPre-anesthesia Checklist: Patient identified, Emergency Drugs available, Suction available and Patient being monitored Patient Re-evaluated:Patient Re-evaluated prior to induction Oxygen Delivery Method: Simple face mask Preoxygenation: Pre-oxygenation with 100% oxygen Induction Type: IV induction Ventilation: Oral airway inserted - appropriate to patient size Dental Injury: Teeth and Oropharynx as per pre-operative assessment

## 2022-07-19 MED FILL — Heparin Sodium (Porcine) Inj 1000 Unit/ML: INTRAMUSCULAR | Qty: 10 | Status: AC

## 2022-07-19 MED FILL — Cefazolin Sodium-Dextrose IV Solution 2 GM/100ML-4%: INTRAVENOUS | Qty: 100 | Status: AC

## 2022-07-19 NOTE — Anesthesia Postprocedure Evaluation (Signed)
Anesthesia Post Note  Patient: Christine Fitzpatrick  Procedure(s) Performed: SVT ABLATION     Patient location during evaluation: PACU Anesthesia Type: General Level of consciousness: awake and alert Pain management: pain level controlled Vital Signs Assessment: post-procedure vital signs reviewed and stable Respiratory status: spontaneous breathing, nonlabored ventilation, respiratory function stable and patient connected to nasal cannula oxygen Cardiovascular status: stable and blood pressure returned to baseline Postop Assessment: no apparent nausea or vomiting Anesthetic complications: no   There were no known notable events for this encounter.  Last Vitals:  Vitals:   07/18/22 1730 07/18/22 1800  BP: 130/77 129/86  Pulse: 87   Resp: 18 (!) 21  Temp:    SpO2: 94%     Last Pain:  Vitals:   07/18/22 1600  TempSrc:   PainSc: 0-No pain                 Belenda Cruise P Samreet [place]

## 2022-07-24 ENCOUNTER — Telehealth: Payer: Self-pay | Admitting: Cardiology

## 2022-07-24 NOTE — Telephone Encounter (Signed)
Patient said that the dentist told her to let Dr. Quentin Ore fill her Amoxicillin antibiotic for dental procedure. Please call to confirm

## 2022-07-24 NOTE — Telephone Encounter (Signed)
Patient has taken 2000 mg prior to dental work for 20 plus years. Original ordered for murmur, with mitral valve. She was getting this last in [place] before she moved here. Will check if Dr. Quentin Ore will fill.

## 2022-07-27 ENCOUNTER — Ambulatory Visit: Payer: Managed Care, Other (non HMO) | Attending: Physician Assistant

## 2022-07-27 DIAGNOSIS — I471 Supraventricular tachycardia, unspecified: Secondary | ICD-10-CM

## 2022-07-29 IMAGING — MG MM DIGITAL SCREENING BILAT W/ TOMO AND CAD
6 of 12 series · 6 of 36 positions shown · non-contrast
Comparison: Previous exam(s).

CLINICAL DATA: Screening.

EXAM:
DIGITAL SCREENING BILATERAL MAMMOGRAM WITH TOMOSYNTHESIS AND CAD
TECHNIQUE: Bilateral screening digital craniocaudal and mediolateral oblique
mammograms were obtained. Bilateral screening digital breast
tomosynthesis was performed. The images were evaluated with
computer-aided detection.

[L MLO synth-2D (1 of 2)]
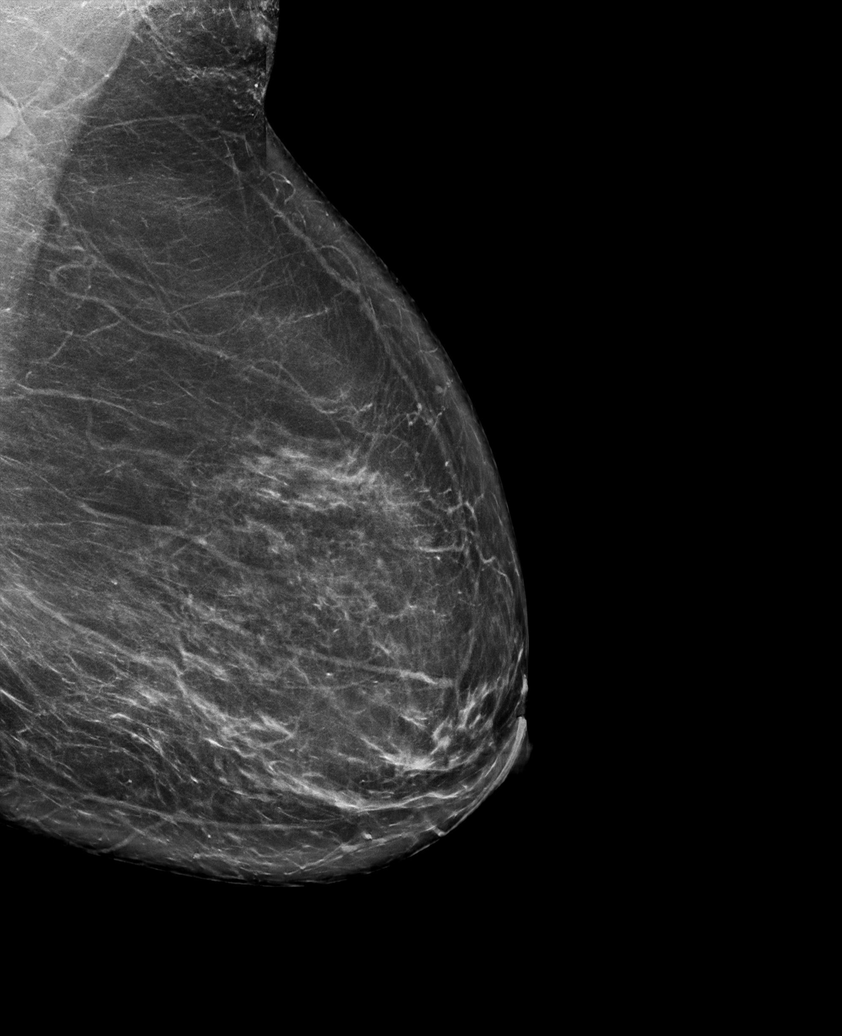

[L MLO synth-2D (2 of 2)]
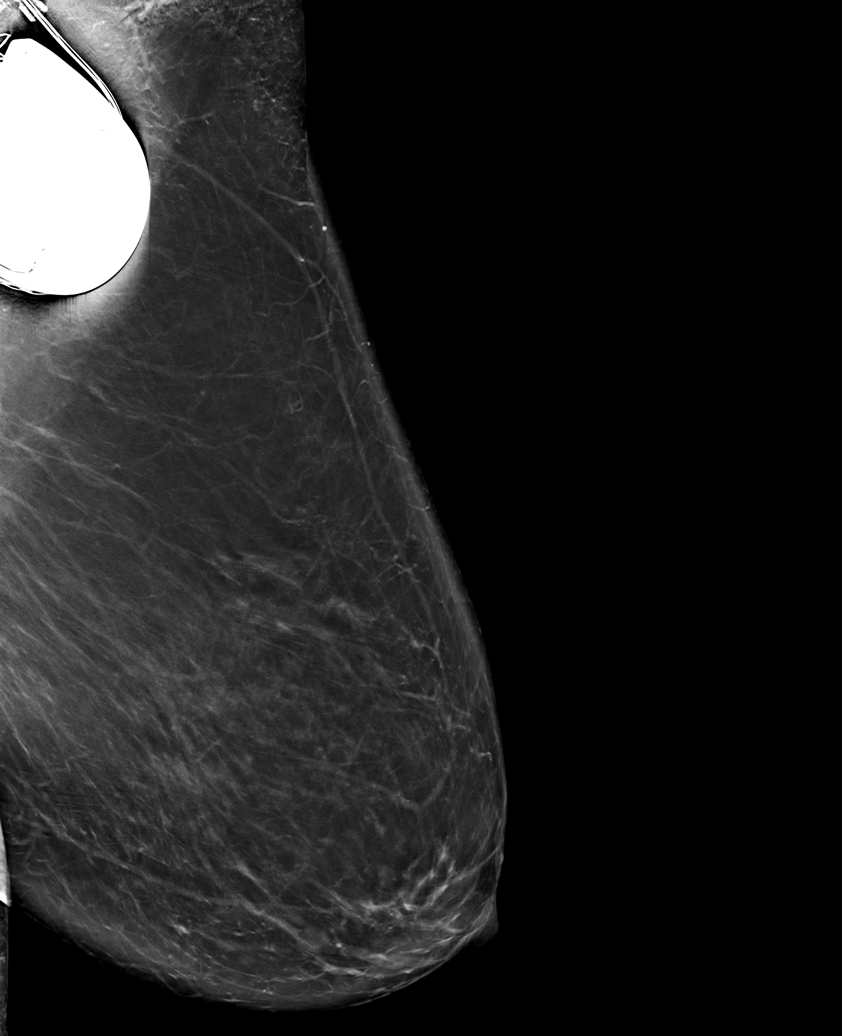

[R CC synth-2D]
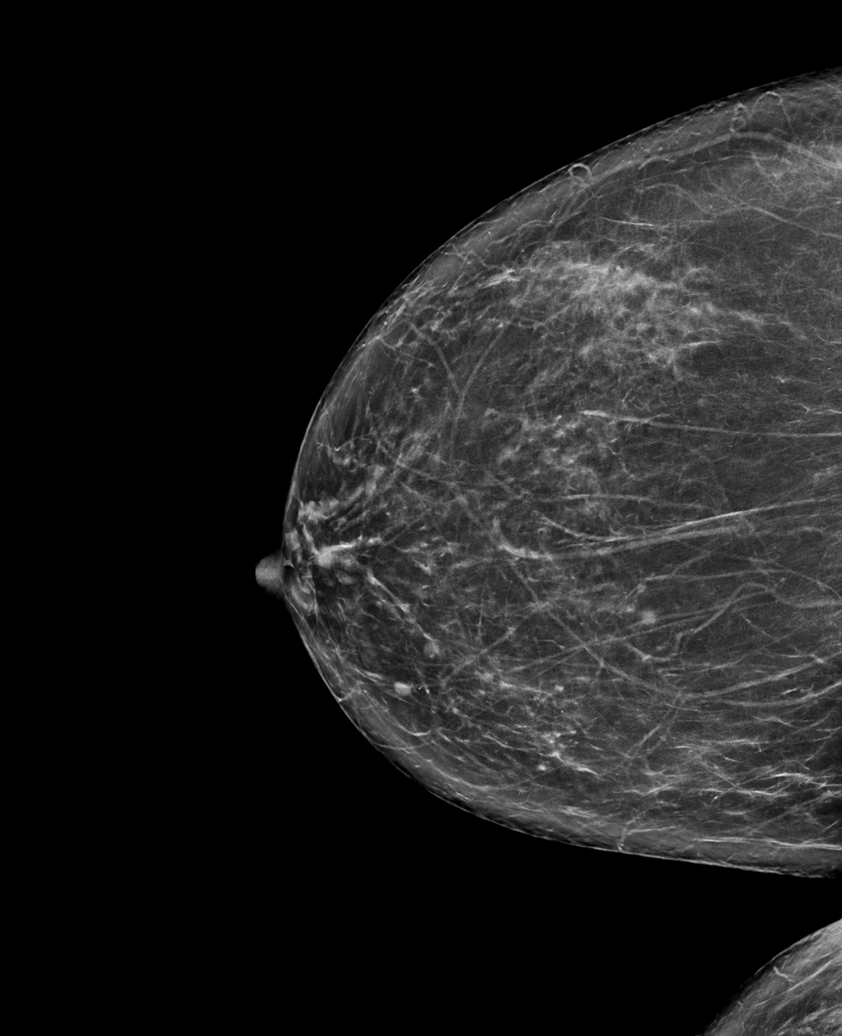

[R MLO synth-2D (1 of 2)]
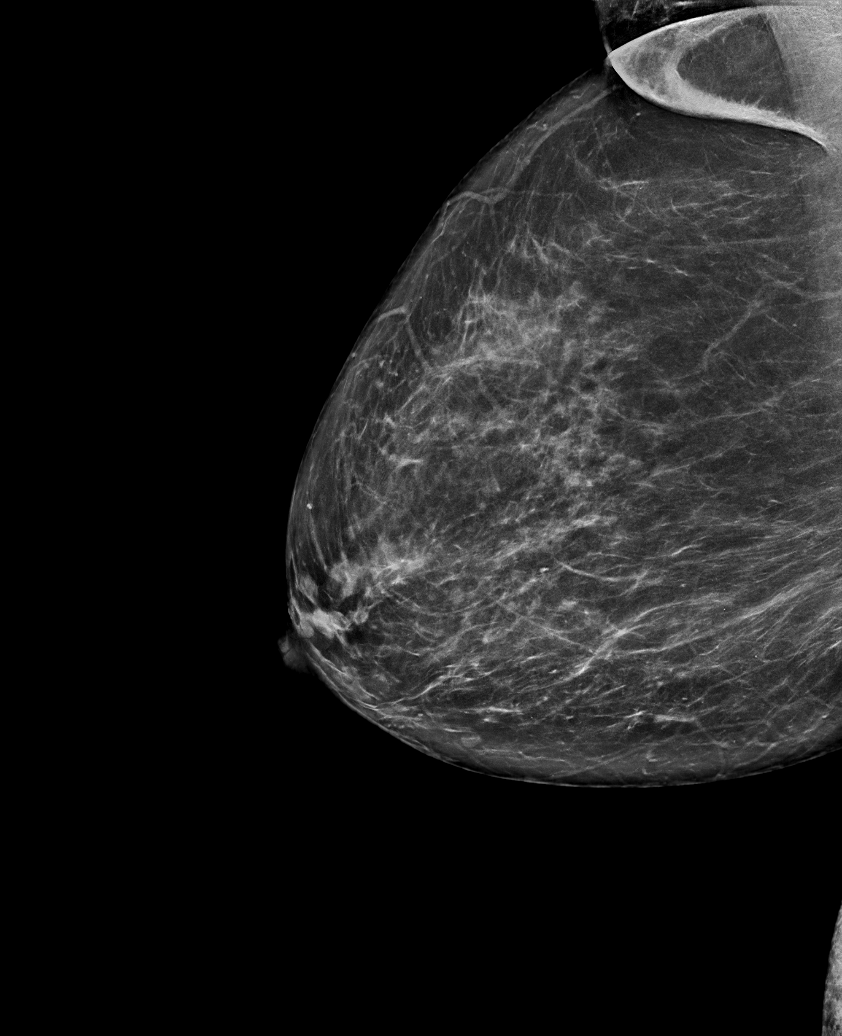

[R MLO synth-2D (2 of 2)]
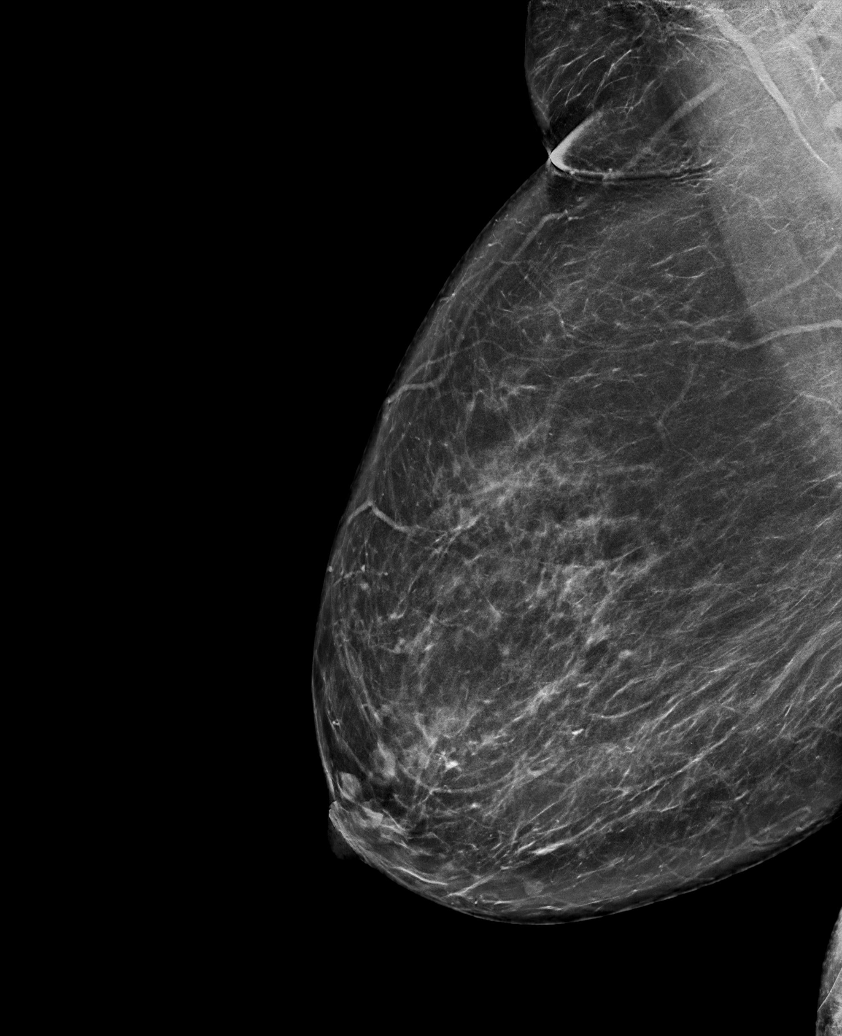

[L CC synth-2D]
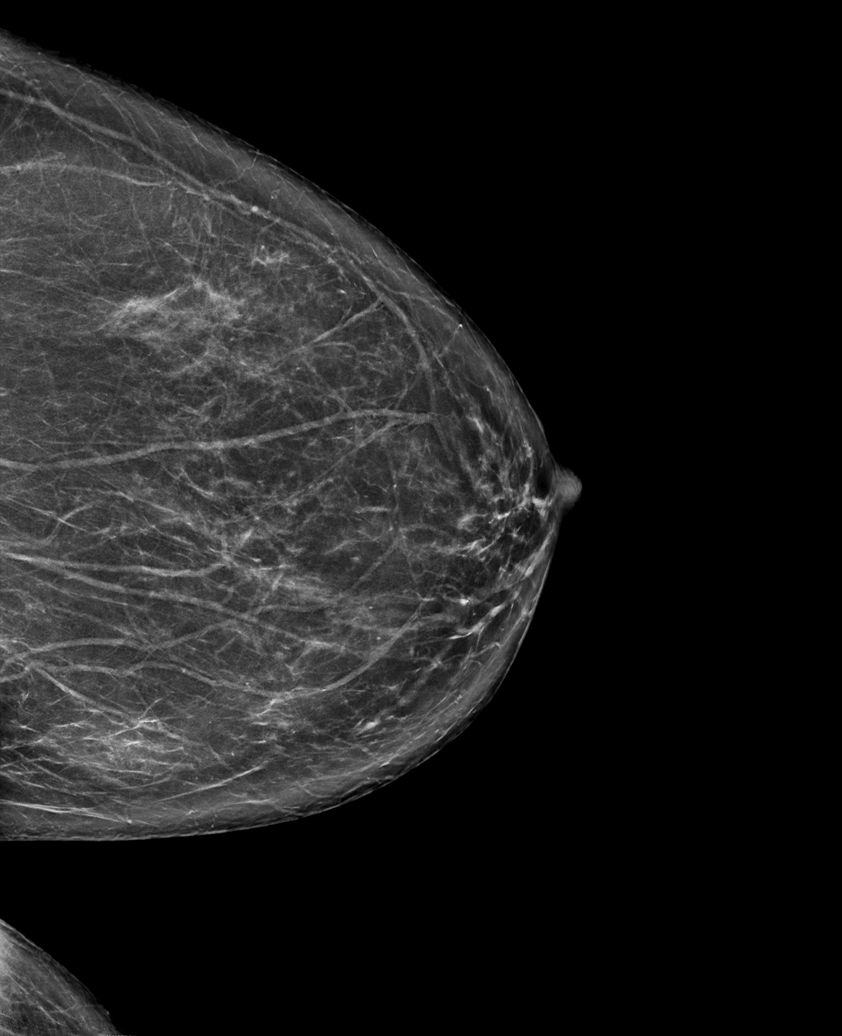

[6 of 36 positions shown; findings below may reference images not displayed]

ACR Breast Density Category b: There are scattered areas of
fibroglandular density.
FINDINGS: There are no findings suspicious for malignancy.
IMPRESSION: No mammographic evidence of malignancy. A result letter of this
screening mammogram will be mailed directly to the patient.

RECOMMENDATION:
Screening mammogram in one year. (Code:51-O-LD2)

BI-RADS CATEGORY  1: Negative.

## 2022-07-31 ENCOUNTER — Encounter: Payer: Self-pay | Admitting: Internal Medicine

## 2022-07-31 ENCOUNTER — Ambulatory Visit (INDEPENDENT_AMBULATORY_CARE_PROVIDER_SITE_OTHER): Payer: Managed Care, Other (non HMO) | Admitting: Internal Medicine

## 2022-07-31 VITALS — BP 123/75 | HR 84 | Ht 68.0 in | Wt 197.4 lb

## 2022-07-31 DIAGNOSIS — R109 Unspecified abdominal pain: Secondary | ICD-10-CM

## 2022-07-31 DIAGNOSIS — E785 Hyperlipidemia, unspecified: Secondary | ICD-10-CM | POA: Diagnosis not present

## 2022-07-31 DIAGNOSIS — I472 Ventricular tachycardia, unspecified: Secondary | ICD-10-CM

## 2022-07-31 DIAGNOSIS — N281 Cyst of kidney, acquired: Secondary | ICD-10-CM

## 2022-07-31 LAB — POCT URINALYSIS DIPSTICK
Bilirubin, UA: NEGATIVE
Blood, UA: POSITIVE
Glucose, UA: NEGATIVE
Ketones, UA: NEGATIVE
Nitrite, UA: NEGATIVE
Protein, UA: POSITIVE — AB
Spec Grav, UA: 1.025 (ref 1.010–1.025)
Urobilinogen, UA: 0.2 E.U./dL
pH, UA: 7.5 (ref 5.0–8.0)

## 2022-07-31 MED ORDER — CIPROFLOXACIN HCL 500 MG PO TABS: 500.0000 mg | ORAL_TABLET | Freq: Two times a day (BID) | ORAL | 0 refills | Status: AC

## 2022-07-31 NOTE — Assessment & Plan Note (Signed)
Stableatthepresenttime ultrasound was done

## 2022-07-31 NOTE — Assessment & Plan Note (Signed)
Ultrasound shows renal cyst

## 2022-07-31 NOTE — Assessment & Plan Note (Signed)
Hypercholesterolemia  I advised the patient to follow Mediterranean diet This diet is rich in fruits vegetables and whole grain, and This diet is also rich in fish and lean meat Patient should also eat a handful of almonds or walnuts daily Recent heart study indicated that average follow-up on this kind of diet reduces the cardiovascular mortality by 50 to 70%== 

## 2022-07-31 NOTE — Progress Notes (Signed)
Established Patient Office Visit  Subjective:  Patient ID: Christine Fitzpatrick, female    DOB: 1955-12-27  Age: 66 y.o. MRN: [id number]  CC:  Chief Complaint  Patient presents with   Follow-up    HPI  Christine Fitzpatrick presents for check up  Past Medical History:  Diagnosis Date   Hyperlipidemia    Hypertension     Past Surgical History:  Procedure Laterality Date   BREAST EXCISIONAL BIOPSY     CARDIAC DEFIBRILLATOR PLACEMENT     CATARACT EXTRACTION Left    MIDDLE EAR SURGERY Left    SVT ABLATION N/A 07/18/2022   Procedure: SVT ABLATION;  Surgeon: Vickie Epley, MD;  Location: [place] [facility];  Service: Cardiovascular;  Laterality: N/A;    Family History  Problem Relation Age of Onset   Hypertension Mother    Hypertension Maternal Aunt    Diabetes Maternal Grandmother     Social History   Socioeconomic History   Marital status: Married    Spouse name: Not on file   Number of children: Not on file   Years of education: Not on file   Highest education level: Not on file  Occupational History   Not on file  Tobacco Use   Smoking status: Never   Smokeless tobacco: Never  Substance and Sexual Activity   Alcohol use: Yes   Drug use: Never   Sexual activity: Not on file  Other Topics Concern   Not on file  Social History Narrative   Not on file   Social Determinants of Health   Financial Resource Strain: Not on file  Food Insecurity: Not on file  Transportation Needs: Not on file  Physical Activity: Not on file  Stress: Not on file  Social Connections: Not on file  Intimate Partner Violence: Not on file     Current Outpatient Medications:    acetaminophen (TYLENOL) 500 MG tablet, Take 500-1,000 mg by mouth every 6 (six) hours as needed (pain.)., Disp: , Rfl:    azelastine (ASTELIN) 0.1 % nasal spray, Place 2 sprays into both nostrils in the morning. Use in each nostril as directed, Disp: , Rfl:    B Complex-C (B-COMPLEX WITH VITAMIN C) tablet, Take 1  tablet by mouth daily in the afternoon., Disp: , Rfl:    CALCIUM PO, Take 1 tablet by mouth daily in the afternoon., Disp: , Rfl:    cetirizine (ZYRTEC) 10 MG tablet, Take 10 mg by mouth daily as needed for allergies., Disp: , Rfl:    Cholecalciferol (VITAMIN D3) 125 MCG (5000 UT) TABS, Take 5,000 Units by mouth daily in the afternoon., Disp: , Rfl:    ciprofloxacin (CIPRO) 500 MG tablet, Take 1 tablet (500 mg total) by mouth 2 (two) times daily for 10 days., Disp: 20 tablet, Rfl: 0   Evolocumab (REPATHA) 140 MG/ML SOSY, Inject 140 mg into the skin every 14 (fourteen) days., Disp: 2.1 mL, Rfl: 3   fluconazole (DIFLUCAN) 150 MG tablet, Take 1 tablet (150 mg total) by mouth once a week. (Patient taking differently: Take 150 mg by mouth every Friday.), Disp: 26 tablet, Rfl: 0   metoprolol succinate (TOPROL XL) 25 MG 24 hr tablet, Take 1 tablet (25 mg total) by mouth daily. (Patient taking differently: Take 25 mg by mouth every evening.), Disp: 90 tablet, Rfl: 0   Allergies  Allergen Reactions   Codeine Hives, Itching and Rash   Morphine And Related Hives, Itching and Rash   Sulfa Antibiotics Hives, Itching  and Rash   Sulfamethoxazole-Trimethoprim Rash    ROS Review of Systems  Constitutional: Negative.   HENT: Negative.    Eyes: Negative.   Respiratory: Negative.    Cardiovascular: Negative.   Gastrointestinal: Negative.        Rt flank pain  Endocrine: Negative.   Genitourinary: Negative.   Musculoskeletal: Negative.   Skin: Negative.   Allergic/Immunologic: Negative.   Neurological: Negative.   Hematological: Negative.   Psychiatric/Behavioral: Negative.    All other systems reviewed and are negative.     Objective:    Physical Exam Vitals reviewed.  Constitutional:      Appearance: Normal appearance.  HENT:     Mouth/Throat:     Mouth: Mucous membranes are moist.  Eyes:     Pupils: Pupils are equal, round, and reactive to light.  Neck:     Vascular: No carotid bruit.   Cardiovascular:     Rate and Rhythm: Normal rate and regular rhythm.     Pulses: Normal pulses.     Heart sounds: Normal heart sounds.  Pulmonary:     Effort: Pulmonary effort is normal.     Breath sounds: Normal breath sounds.  Abdominal:     General: Bowel sounds are normal.     Palpations: Abdomen is soft. There is no hepatomegaly, splenomegaly or mass.     Tenderness: There is abdominal tenderness.     Hernia: No hernia is present.       Comments: tenderness  Musculoskeletal:        General: No tenderness.     Cervical back: Neck supple.     Right lower leg: No edema.     Left lower leg: No edema.  Skin:    Findings: No rash.  Neurological:     Mental Status: She is alert and oriented to person, place, and time.     Motor: No weakness.  Psychiatric:        Mood and Affect: Mood and affect normal.        Behavior: Behavior normal.     BP 123/75   Pulse 84   Ht 5' 8" (1.727 m)   Wt 197 lb 6.4 oz (89.5 kg)   BMI 30.01 kg/m  Wt Readings from Last 3 Encounters:  07/31/22 197 lb 6.4 oz (89.5 kg)  07/18/22 190 lb (86.2 kg)  07/05/22 194 lb 6.4 oz (88.2 kg)     Health Maintenance Due  Topic Date Due   PAP SMEAR-Modifier  Never done   COVID-19 Vaccine (4 - Booster for Janssen series) 04/04/2021   INFLUENZA VACCINE  06/06/2022    There are no preventive care reminders to display for this patient.  Lab Results  Component Value Date   TSH 1.45 11/23/2021   Lab Results  Component Value Date   WBC 6.0 07/05/2022   HGB 14.4 07/05/2022   HCT 44.9 07/05/2022   MCV 94.9 07/05/2022   PLT 299 07/05/2022   Lab Results  Component Value Date   NA 139 07/05/2022   K 4.3 07/05/2022   CO2 25 07/05/2022   GLUCOSE 117 (H) 07/05/2022   BUN 12 07/05/2022   CREATININE 0.88 07/05/2022   BILITOT 0.5 11/23/2021   AST 21 11/23/2021   ALT 26 11/23/2021   PROT 6.6 11/23/2021   CALCIUM 9.5 07/05/2022   ANIONGAP 5 07/05/2022   EGFR 73 11/23/2021   Lab Results   Component Value Date   CHOL 248 (H) 03/20/2022   Lab Results  Component Value Date   HDL 54 03/20/2022   Lab Results  Component Value Date   LDLCALC 164 (H) 03/20/2022   Lab Results  Component Value Date   TRIG 157 (H) 03/20/2022   Lab Results  Component Value Date   CHOLHDL 4.6 03/20/2022   No results found for: "HGBA1C"    Assessment & Plan:   Problem List Items Addressed This Visit       Cardiovascular and Mediastinum   Ventricular tachycardia ([name])     Genitourinary   Simple renal cyst    Stableatthepresenttime ultrasound was done        Other   Flank pain - Primary    Ultrasound shows renal cyst      Relevant Medications   ciprofloxacin (CIPRO) 500 MG tablet   Other Relevant Orders   POCT urinalysis dipstick (Completed)   Hyperlipidemia    Hypercholesterolemia  I advised the patient to follow Mediterranean diet This diet is rich in fruits vegetables and whole grain, and This diet is also rich in fish and lean meat Patient should also eat a handful of almonds or walnuts daily Recent heart study indicated that average follow-up on this kind of diet reduces the cardiovascular mortality by 50 to 70%==       Meds ordered this encounter  Medications   ciprofloxacin (CIPRO) 500 MG tablet    Sig: Take 1 tablet (500 mg total) by mouth 2 (two) times daily for 10 days.    Dispense:  20 tablet    Refill:  0    Follow-up: No follow-ups on file.    Cletis Athens, MD

## 2022-08-07 ENCOUNTER — Ambulatory Visit (HOSPITAL_COMMUNITY): Payer: Managed Care, Other (non HMO) | Admitting: Physician Assistant

## 2022-08-15 LAB — CUP PACEART REMOTE DEVICE CHECK
Battery Remaining Longevity: 150 mo
Battery Voltage: 3.03 V
Brady Statistic RA Percent Paced: INVALID
Brady Statistic RV Percent Paced: 0.03 %
Date Time Interrogation Session: 20231009184048
HighPow Impedance: 38 Ohm
HighPow Impedance: 47 Ohm
Implantable Lead Implant Date: 20130501
Implantable Lead Location: 753860
Implantable Pulse Generator Implant Date: 20220322
Lead Channel Impedance Value: 456 Ohm
Lead Channel Impedance Value: 513 Ohm
Lead Channel Pacing Threshold Amplitude: 0.5 V
Lead Channel Pacing Threshold Pulse Width: 0.4 ms
Lead Channel Sensing Intrinsic Amplitude: 8.9 mV
Lead Channel Setting Pacing Amplitude: 1.5 V
Lead Channel Setting Pacing Pulse Width: 0.4 ms
Lead Channel Setting Sensing Sensitivity: 0.45 mV

## 2022-08-16 ENCOUNTER — Other Ambulatory Visit: Payer: Self-pay | Admitting: Internal Medicine

## 2022-08-16 ENCOUNTER — Ambulatory Visit (INDEPENDENT_AMBULATORY_CARE_PROVIDER_SITE_OTHER): Payer: Managed Care, Other (non HMO)

## 2022-08-16 DIAGNOSIS — I469 Cardiac arrest, cause unspecified: Secondary | ICD-10-CM

## 2022-08-23 ENCOUNTER — Other Ambulatory Visit: Payer: Self-pay | Admitting: Cardiology

## 2022-08-23 ENCOUNTER — Ambulatory Visit: Payer: Managed Care, Other (non HMO) | Admitting: Cardiology

## 2022-08-23 NOTE — Progress Notes (Deleted)
Electrophysiology Office Follow up Visit Note:    Date:  08/23/2022   ID:  Christine Fitzpatrick, DOB 1956-01-06, MRN [id number]  PCP:  Cletis Athens, MD  [facility] Cardiologist:  None  [facility] Electrophysiologist:  Vickie Epley, MD    Interval History:    Christine Fitzpatrick is a 66 y.o. female who presents for a follow up visit.  She had an EP study on 07/18/2022 during which there were no inducible arrhythmias.         Past Medical History:  Diagnosis Date   Hyperlipidemia    Hypertension     Past Surgical History:  Procedure Laterality Date   BREAST EXCISIONAL BIOPSY     CARDIAC DEFIBRILLATOR PLACEMENT     CATARACT EXTRACTION Left    MIDDLE EAR SURGERY Left    SVT ABLATION N/A 07/18/2022   Procedure: SVT ABLATION;  Surgeon: Vickie Epley, MD;  Location: [place] [facility];  Service: Cardiovascular;  Laterality: N/A;    Current Medications: No outpatient medications have been marked as taking for the 08/23/22 encounter (Appointment) with Vickie Epley, MD.     Allergies:   Codeine, Morphine and related, Sulfa antibiotics, and Sulfamethoxazole-trimethoprim   Social History   Socioeconomic History   Marital status: Married    Spouse name: Not on file   Number of children: Not on file   Years of education: Not on file   Highest education level: Not on file  Occupational History   Not on file  Tobacco Use   Smoking status: Never   Smokeless tobacco: Never  Substance and Sexual Activity   Alcohol use: Yes   Drug use: Never   Sexual activity: Not on file  Other Topics Concern   Not on file  Social History Narrative   Not on file   Social Determinants of Health   Financial Resource Strain: Not on file  Food Insecurity: Not on file  Transportation Needs: Not on file  Physical Activity: Not on file  Stress: Not on file  Social Connections: Not on file     Family History: The patient's family history includes Diabetes in her maternal  grandmother; Hypertension in her maternal aunt and mother.  ROS:   Please see the history of present illness.    All other systems reviewed and are negative.  EKGs/Labs/Other Studies Reviewed:    The following studies were reviewed today: ***  EKG:  The ekg ordered today demonstrates ***  Recent Labs: 11/23/2021: ALT 26; TSH 1.45 07/05/2022: BUN 12; Creatinine, Ser 0.88; Hemoglobin 14.4; Platelets 299; Potassium 4.3; Sodium 139  Recent Lipid Panel    Component Value Date/Time   CHOL 248 (H) 03/20/2022 0902   TRIG 157 (H) 03/20/2022 0902   HDL 54 03/20/2022 0902   CHOLHDL 4.6 03/20/2022 0902   LDLCALC 164 (H) 03/20/2022 0902    Physical Exam:    VS:  There were no vitals taken for this visit.    Wt Readings from Last 3 Encounters:  07/31/22 197 lb 6.4 oz (89.5 kg)  07/18/22 190 lb (86.2 kg)  07/05/22 194 lb 6.4 oz (88.2 kg)     GEN: *** Well nourished, well developed in no acute distress HEENT: Normal NECK: No JVD; No carotid bruits LYMPHATICS: No lymphadenopathy CARDIAC: ***RRR, no murmurs, rubs, gallops RESPIRATORY:  Clear to auscultation without rales, wheezing or rhonchi  ABDOMEN: Soft, non-tender, non-distended MUSCULOSKELETAL:  No edema; No deformity  SKIN: Warm and dry NEUROLOGIC:  Alert and oriented x  3 PSYCHIATRIC:  Normal affect        ASSESSMENT:    No diagnosis found. PLAN:    In order of problems listed above:           Total time spent with patient today *** minutes. This includes reviewing records, evaluating the patient and coordinating care.   Medication Adjustments/Labs and Tests Ordered: Current medicines are reviewed at length with the patient today.  Concerns regarding medicines are outlined above.  No orders of the defined types were placed in this encounter.  No orders of the defined types were placed in this encounter.    Signed, Lars Mage, MD, [facility], [facility] 08/23/2022 7:46 AM    Electrophysiology [facility]

## 2022-08-25 ENCOUNTER — Encounter: Payer: Self-pay | Admitting: Nurse Practitioner

## 2022-08-25 ENCOUNTER — Ambulatory Visit (INDEPENDENT_AMBULATORY_CARE_PROVIDER_SITE_OTHER): Payer: Managed Care, Other (non HMO) | Admitting: Nurse Practitioner

## 2022-08-25 VITALS — BP 118/75 | HR 86 | Ht 68.0 in | Wt 198.5 lb

## 2022-08-25 DIAGNOSIS — Z0000 Encounter for general adult medical examination without abnormal findings: Secondary | ICD-10-CM | POA: Diagnosis not present

## 2022-08-25 NOTE — Progress Notes (Signed)
Established Patient Office Visit  Subjective:  Patient ID: Christine Fitzpatrick, female    DOB: 1955-12-31  Age: 66 y.o. MRN: [id number]  CC:  Chief Complaint  Patient presents with   Annual Exam     HPI  Christine Fitzpatrick presents for annual physical.  Flu: October 2023 Tetanus: 2020 COVID: Pfizer Pap smear:  Dentist: September 2023 Eye examination: due  Exercise: Walk 2 miles daily  and online exercise 3-4 days Colonoscopy: 2020 Mammogram : 2023  Diet: Patient does eat meat. Patient consumes fruits and veggies. Patient eat fried food once a week. Patient drinks mainly water and some tea/soda.  HPI   Past Medical History:  Diagnosis Date   Hyperlipidemia    Hypertension     Past Surgical History:  Procedure Laterality Date   BREAST EXCISIONAL BIOPSY     CARDIAC DEFIBRILLATOR PLACEMENT     CATARACT EXTRACTION Left    MIDDLE EAR SURGERY Left    SVT ABLATION N/A 07/18/2022   Procedure: SVT ABLATION;  Surgeon: Vickie Epley, MD;  Location: [place] [facility];  Service: Cardiovascular;  Laterality: N/A;    Family History  Problem Relation Age of Onset   Hypertension Mother    Hypertension Maternal Aunt    Diabetes Maternal Grandmother     Social History   Socioeconomic History   Marital status: Married    Spouse name: Not on file   Number of children: Not on file   Years of education: Not on file   Highest education level: Not on file  Occupational History   Not on file  Tobacco Use   Smoking status: Never   Smokeless tobacco: Never  Substance and Sexual Activity   Alcohol use: Yes   Drug use: Never   Sexual activity: Not on file  Other Topics Concern   Not on file  Social History Narrative   Not on file   Social Determinants of Health   Financial Resource Strain: Not on file  Food Insecurity: Not on file  Transportation Needs: Not on file  Physical Activity: Not on file  Stress: Not on file  Social Connections: Not on file  Intimate Partner  Violence: Not on file     Outpatient Medications Prior to Visit  Medication Sig Dispense Refill   acetaminophen (TYLENOL) 500 MG tablet Take 500-1,000 mg by mouth every 6 (six) hours as needed (pain.).     azelastine (ASTELIN) 0.1 % nasal spray Place 2 sprays into both nostrils in the morning. Use in each nostril as directed     B Complex-C (B-COMPLEX WITH VITAMIN C) tablet Take 1 tablet by mouth daily in the afternoon.     CALCIUM PO Take 1 tablet by mouth daily in the afternoon.     cetirizine (ZYRTEC) 10 MG tablet Take 10 mg by mouth daily as needed for allergies.     Cholecalciferol (VITAMIN D3) 125 MCG (5000 UT) TABS Take 5,000 Units by mouth daily in the afternoon.     fluconazole (DIFLUCAN) 150 MG tablet Take 1 tablet (150 mg total) by mouth once a week. (Patient taking differently: Take 150 mg by mouth every Friday.) 26 tablet 0   metoprolol succinate (TOPROL-XL) 25 MG 24 hr tablet TAKE 1 TABLET (25 MG TOTAL) BY MOUTH DAILY. 90 tablet 0   REPATHA 140 MG/ML SOSY INJECT THE CONTENTS OF ONE SYRINGE INTO THE SKIN EVERY 14 DAYS. 1 mL 1   No facility-administered medications prior to visit.    Allergies  Allergen Reactions   Codeine Hives, Itching and Rash   Morphine And Related Hives, Itching and Rash   Sulfa Antibiotics Hives, Itching and Rash   Sulfamethoxazole-Trimethoprim Rash    ROS Review of Systems  Constitutional: Negative.   HENT: Negative.    Respiratory:  Negative for chest tightness and shortness of breath.   Cardiovascular:  Negative for chest pain and palpitations.  Gastrointestinal:  Negative for abdominal pain and blood in stool.  Genitourinary: Negative.   Musculoskeletal:  Positive for back pain.  Skin: Negative.   Neurological:  Negative for dizziness, facial asymmetry and headaches.  Psychiatric/Behavioral:  Negative for agitation, behavioral problems and confusion.       Objective:    Physical Exam Constitutional:      Appearance: Normal  appearance. She is obese.  HENT:     Head: Normocephalic.     Right Ear: Tympanic membrane normal.     Left Ear: Tympanic membrane normal.     Nose: Nose normal.     Mouth/Throat:     Mouth: Mucous membranes are moist.     Pharynx: Oropharynx is clear.  Eyes:     Extraocular Movements: Extraocular movements intact.     Conjunctiva/sclera: Conjunctivae normal.     Pupils: Pupils are equal, round, and reactive to light.  Cardiovascular:     Rate and Rhythm: Normal rate and regular rhythm.     Pulses: Normal pulses.     Heart sounds: Normal heart sounds.  Pulmonary:     Effort: Pulmonary effort is normal. No respiratory distress.     Breath sounds: Normal breath sounds. No rhonchi.  Abdominal:     General: Bowel sounds are normal.     Palpations: Abdomen is soft. There is no mass.     Tenderness: There is no abdominal tenderness.     Hernia: No hernia is present.  Musculoskeletal:        General: Normal range of motion.     Cervical back: Neck supple. No tenderness.  Skin:    General: Skin is warm.     Capillary Refill: Capillary refill takes less than 2 seconds.  Neurological:     General: No focal deficit present.     Mental Status: She is alert and oriented to person, place, and time. Mental status is at baseline.  Psychiatric:        Mood and Affect: Mood normal.        Behavior: Behavior normal.        Thought Content: Thought content normal.        Judgment: Judgment normal.     BP 118/75   Pulse 86   Ht _0  (1.727 m)   Wt 198 lb 8 oz (90 kg)   BMI 30.18 kg/m  Wt Readings from Last 3 Encounters:  08/25/22 198 lb 8 oz (90 kg)  07/31/22 197 lb 6.4 oz (89.5 kg)  07/18/22 190 lb (86.2 kg)     Health Maintenance  Topic Date Due   MAMMOGRAM  01/24/2024   TETANUS/TDAP  01/08/2029   COLONOSCOPY (Pts 45-27yr Insurance coverage will need to be confirmed)  11/06/2029   INFLUENZA VACCINE  Completed   COVID-19 Vaccine  Completed   Hepatitis C Screening   Completed   HIV Screening  Completed   Zoster Vaccines- Shingrix  Completed   HPV VACCINES  Aged Out   PAP SMEAR-Modifier  Discontinued    There are no preventive care reminders to display for this patient.  Lab Results  Component Value Date   TSH 1.45 11/23/2021   Lab Results  Component Value Date   WBC 6.0 07/05/2022   HGB 14.4 07/05/2022   HCT 44.9 07/05/2022   MCV 94.9 07/05/2022   PLT 299 07/05/2022   Lab Results  Component Value Date   NA 139 07/05/2022   K 4.3 07/05/2022   CO2 25 07/05/2022   GLUCOSE 117 (H) 07/05/2022   BUN 12 07/05/2022   CREATININE 0.88 07/05/2022   BILITOT 0.5 11/23/2021   AST 21 11/23/2021   ALT 26 11/23/2021   PROT 6.6 11/23/2021   CALCIUM 9.5 07/05/2022   ANIONGAP 5 07/05/2022   EGFR 73 11/23/2021   Lab Results  Component Value Date   CHOL 248 (H) 03/20/2022   Lab Results  Component Value Date   HDL 54 03/20/2022   Lab Results  Component Value Date   LDLCALC 164 (H) 03/20/2022   Lab Results  Component Value Date   TRIG 157 (H) 03/20/2022   Lab Results  Component Value Date   CHOLHDL 4.6 03/20/2022   No results found for: "HGBA1C"    Assessment & Plan:   Problem List Items Addressed This Visit       Other   Annual physical exam - Primary    Encouraged patient to consume a balanced diet and regular exercise regimen. Advised to see an eye doctor and dentist annually.  Up-to-date with vaccinations        No orders of the defined types were placed in this encounter.    Follow-up: No follow-ups on file.    Theresia Lo, NP

## 2022-08-30 NOTE — Progress Notes (Signed)
Remote ICD transmission.   

## 2022-09-05 ENCOUNTER — Encounter: Payer: Self-pay | Admitting: Nurse Practitioner

## 2022-09-05 NOTE — Assessment & Plan Note (Signed)
Encouraged patient to consume a balanced diet and regular exercise regimen. Advised to see an eye doctor and dentist annually.  Up-to-date with vaccinations

## 2022-09-10 ENCOUNTER — Other Ambulatory Visit: Payer: Self-pay | Admitting: Internal Medicine

## 2022-09-11 ENCOUNTER — Other Ambulatory Visit: Payer: Self-pay

## 2022-09-11 MED ORDER — REPATHA 140 MG/ML ~~LOC~~ SOSY: PREFILLED_SYRINGE | SUBCUTANEOUS | 1 refills | Status: DC

## 2022-10-03 NOTE — Progress Notes (Unsigned)
Electrophysiology Office Follow up Visit Note:    Date:  10/04/2022   ID:  Christine Fitzpatrick, DOB November 10, 1955, MRN [id number]  PCP:  Cletis Athens, MD  [facility] Cardiologist:  None  [facility] Electrophysiologist:  Vickie Epley, MD    Interval History:    Christine Fitzpatrick is a 66 y.o. female who presents for a follow up visit. She has a history of SVT s/p EP study 07/18/2022 during which no arrhythmias were inducible.   She has been doing well.  No recurrence of her arrhythmia that she is aware of.  No problems with her defibrillator.       Past Medical History:  Diagnosis Date   Hyperlipidemia    Hypertension     Past Surgical History:  Procedure Laterality Date   BREAST EXCISIONAL BIOPSY     CARDIAC DEFIBRILLATOR PLACEMENT     CATARACT EXTRACTION Left    MIDDLE EAR SURGERY Left    SVT ABLATION N/A 07/18/2022   Procedure: SVT ABLATION;  Surgeon: Vickie Epley, MD;  Location: [place] [facility];  Service: Cardiovascular;  Laterality: N/A;    Current Medications: Current Meds  Medication Sig   acetaminophen (TYLENOL) 500 MG tablet Take 500-1,000 mg by mouth every 6 (six) hours as needed (pain.).   azelastine (ASTELIN) 0.1 % nasal spray Place 2 sprays into both nostrils in the morning. Use in each nostril as directed   B Complex-C (B-COMPLEX WITH VITAMIN C) tablet Take 1 tablet by mouth daily in the afternoon.   CALCIUM PO Take 1 tablet by mouth daily in the afternoon.   cetirizine (ZYRTEC) 10 MG tablet Take 10 mg by mouth daily as needed for allergies.   Cholecalciferol (VITAMIN D3) 125 MCG (5000 UT) TABS Take 5,000 Units by mouth daily in the afternoon.   Evolocumab (REPATHA) 140 MG/ML SOSY INJECT THE CONTENTS OF ONE SYRINGE INTO THE SKIN EVERY 14 DAYS.   fluconazole (DIFLUCAN) 150 MG tablet Take 1 tablet (150 mg total) by mouth once a week. (Patient taking differently: Take 150 mg by mouth every Friday.)   metoprolol succinate (TOPROL-XL) 25 MG 24 hr tablet TAKE  1 TABLET (25 MG TOTAL) BY MOUTH DAILY.     Allergies:   Codeine, Morphine and related, Sulfa antibiotics, and Sulfamethoxazole-trimethoprim   Social History   Socioeconomic History   Marital status: Married    Spouse name: Not on file   Number of children: Not on file   Years of education: Not on file   Highest education level: Not on file  Occupational History   Not on file  Tobacco Use   Smoking status: Never   Smokeless tobacco: Never  Substance and Sexual Activity   Alcohol use: Yes   Drug use: Never   Sexual activity: Not on file  Other Topics Concern   Not on file  Social History Narrative   Not on file   Social Determinants of Health   Financial Resource Strain: Not on file  Food Insecurity: Not on file  Transportation Needs: Not on file  Physical Activity: Not on file  Stress: Not on file  Social Connections: Not on file     Family History: The patient's family history includes Diabetes in her maternal grandmother; Hypertension in her maternal aunt and mother.  ROS:   Please see the history of present illness.    All other systems reviewed and are negative.  EKGs/Labs/Other Studies Reviewed:    The following studies were reviewed today:  10/03/2022 In clinic device interrogation personally reviewed Battery and lead parameter stable.  No sustained arrhythmias.  Recent Labs: 11/23/2021: ALT 26; TSH 1.45 07/05/2022: BUN 12; Creatinine, Ser 0.88; Hemoglobin 14.4; Platelets 299; Potassium 4.3; Sodium 139  Recent Lipid Panel    Component Value Date/Time   CHOL 248 (H) 03/20/2022 0902   TRIG 157 (H) 03/20/2022 0902   HDL 54 03/20/2022 0902   CHOLHDL 4.6 03/20/2022 0902   LDLCALC 164 (H) 03/20/2022 0902    Physical Exam:    VS:  BP 122/72   Pulse 80   Ht '5\' 8"'$  (1.727 m)   Wt 193 lb (87.5 kg)   SpO2 99%   BMI 29.35 kg/m     Wt Readings from Last 3 Encounters:  10/04/22 193 lb (87.5 kg)  08/25/22 198 lb 8 oz (90 kg)  07/31/22 197 lb 6.4 oz  (89.5 kg)     GEN:  Well nourished, well developed in no acute distress HEENT: Normal NECK: No JVD; No carotid bruits LYMPHATICS: No lymphadenopathy CARDIAC: RRR, no murmurs, rubs, gallops.  ICD pocket well-healed RESPIRATORY:  Clear to auscultation without rales, wheezing or rhonchi  ABDOMEN: Soft, non-tender, non-distended MUSCULOSKELETAL:  No edema; No deformity  SKIN: Warm and dry NEUROLOGIC:  Alert and oriented x 3 PSYCHIATRIC:  Normal affect        ASSESSMENT:    1. Supraventricular tachycardia   2. Cardiac arrest ([place])   3. Ventricular fibrillation ([name])    PLAN:    In order of problems listed above:  #SVT No inducible arrhythmia during recent EP study in Sept 2023. Continue metoprolol.  #VF #Cardiac Arrest hx #ICD in situ Device functioning appropriately, continue remote monitoring.  Follow up 12 months with APP.   Medication Adjustments/Labs and Tests Ordered: Current medicines are reviewed at length with the patient today.  Concerns regarding medicines are outlined above.  Orders Placed This Encounter  Procedures   EKG 12-Lead   No orders of the defined types were placed in this encounter.    Signed, Lars Mage, MD, [facility], [facility] 10/04/2022 8:31 AM    Electrophysiology [facility]

## 2022-10-04 ENCOUNTER — Encounter: Payer: Self-pay | Admitting: Cardiology

## 2022-10-04 ENCOUNTER — Ambulatory Visit: Payer: Managed Care, Other (non HMO) | Attending: Cardiology | Admitting: Cardiology

## 2022-10-04 VITALS — BP 122/72 | HR 80 | Ht 68.0 in | Wt 193.0 lb

## 2022-10-04 DIAGNOSIS — I469 Cardiac arrest, cause unspecified: Secondary | ICD-10-CM | POA: Diagnosis not present

## 2022-10-04 DIAGNOSIS — I4901 Ventricular fibrillation: Secondary | ICD-10-CM | POA: Diagnosis not present

## 2022-10-04 DIAGNOSIS — I471 Supraventricular tachycardia, unspecified: Secondary | ICD-10-CM | POA: Diagnosis not present

## 2022-10-04 LAB — CUP PACEART INCLINIC DEVICE CHECK
Date Time Interrogation Session: 20231129090535
Implantable Lead Connection Status: 753985
Implantable Lead Implant Date: 20130501
Implantable Lead Location: 753860
Implantable Pulse Generator Implant Date: 20220322

## 2022-10-04 NOTE — Patient Instructions (Signed)
Medication Instructions:  Your physician recommends that you continue on your current medications as directed. Please refer to the Current Medication list given to you today.  *If you need a refill on your cardiac medications before your next appointment, please call your pharmacy*  Follow-Up: At [facility], you and your health needs are our priority.  As part of our continuing mission to provide you with exceptional heart care, we have created designated Provider Care Teams.  These Care Teams include your primary Cardiologist (physician) and Advanced Practice Providers (APPs -  Physician Assistants and Nurse Practitioners) who all work together to provide you with the care you need, when you need it.  Your next appointment:   1 year(s)  The format for your next appointment:   In Person  Provider:   You will see one of the following Advanced Practice Providers on your designated Care Team:   Tommye Standard, [place] Legrand Como "Jonni Sanger" Chalmers Cater, [place]   Important Information About Sugar

## 2022-10-11 ENCOUNTER — Ambulatory Visit: Payer: Managed Care, Other (non HMO) | Admitting: Cardiology

## 2022-11-01 ENCOUNTER — Ambulatory Visit (INDEPENDENT_AMBULATORY_CARE_PROVIDER_SITE_OTHER): Payer: Managed Care, Other (non HMO)

## 2022-11-01 ENCOUNTER — Ambulatory Visit (INDEPENDENT_AMBULATORY_CARE_PROVIDER_SITE_OTHER): Payer: Managed Care, Other (non HMO) | Admitting: Podiatry

## 2022-11-01 DIAGNOSIS — M2011 Hallux valgus (acquired), right foot: Secondary | ICD-10-CM

## 2022-11-01 DIAGNOSIS — M21612 Bunion of left foot: Secondary | ICD-10-CM

## 2022-11-01 DIAGNOSIS — M2012 Hallux valgus (acquired), left foot: Secondary | ICD-10-CM

## 2022-11-01 DIAGNOSIS — M21611 Bunion of right foot: Secondary | ICD-10-CM

## 2022-11-02 ENCOUNTER — Telehealth: Payer: Self-pay

## 2022-11-02 NOTE — Telephone Encounter (Signed)
Received surgery paperwork from the [place] office. Left a message for [facility] to call and schedule surgery with Dr. Sherryle Lis

## 2022-11-04 NOTE — Progress Notes (Signed)
  Subjective:  Patient ID: Christine Fitzpatrick, female    DOB: 1956-03-11,  MRN: [id number]  Chief Complaint  Patient presents with   Nail Problem    follow up after nail fungus treatment; Xray both feet for bunion consult   Bunions    66 y.o. female presents with the above complaint. History confirmed with patient.  Bunions are becoming more bothersome and would like to schedule surgery.  Has not improved despite wider use of shoes  Objective:  Physical Exam: warm, good capillary refill, no trophic changes or ulcerative lesions, normal DP and PT pulses, and normal sensory exam.  Hypermobility of the first ray bilaterally Left Foot: bunion deformity noted and severe onychomycosis left hallux Right Foot: Bunion deformity noted and onychomycosis right hallux noted mild  New radiographs taken today of both feet show a moderate to severe increase in the intermetatarsal angle with hallux abduction  Assessment:   1. Hallux valgus with bunions, left   2. Hallux valgus with bunions, right      Plan:  Patient was evaluated and treated and all questions answered.  Discussed the etiology and treatment including surgical and non surgical treatment for painful bunions. She has exhausted all non surgical treatment prior to this visit including shoe gear changes and padding. She desires surgical intervention. We discussed all risks including but not limited to: pain, swelling, infection, scar, numbness which may be temporary or permanent, chronic pain, stiffness, nerve pain or damage, wound healing problems, bone healing problems including delayed or non-union and recurrence. Specifically we discussed the following procedures: Lapidus bunionectomy with possible Akin and bone graft from heel. Informed consent was signed today. Surgery will be scheduled at a mutually agreeable date. Information regarding this will be forwarded to our surgery scheduler.  She does have a pacemaker and will require cardiology  clearance prior to surgery, she has not had any major cardiac events since having the pacemaker installed and has good cardiac function.    Surgical plan:  Procedure: -Left foot Lapidus bunionectomy, possible Akin, possible bone graft from heel  Location: -[facility]  Anesthesia plan: -IV sedation with regional block  Postoperative pain plan: - Tylenol 1000 mg every 6 hours, ibuprofen 600 mg every 8 hours, gabapentin 300 mg every 8 hours x5 days, oxycodone 5 mg 1-2 tabs every 6 hours only as needed  DVT prophylaxis: -ASA 325 mg twice daily  WB Restrictions / DME needs: -NWB in splint postop   No follow-ups on file.

## 2022-11-15 ENCOUNTER — Ambulatory Visit (INDEPENDENT_AMBULATORY_CARE_PROVIDER_SITE_OTHER): Payer: Managed Care, Other (non HMO)

## 2022-11-15 DIAGNOSIS — I471 Supraventricular tachycardia, unspecified: Secondary | ICD-10-CM

## 2022-11-15 LAB — CUP PACEART REMOTE DEVICE CHECK
Battery Remaining Longevity: 146 mo
Battery Voltage: 3.04 V
Brady Statistic RA Percent Paced: INVALID
Brady Statistic RV Percent Paced: 0.03 %
Date Time Interrogation Session: 20240108072225
HighPow Impedance: 43 Ohm
HighPow Impedance: 54 Ohm
Implantable Lead Connection Status: 753985
Implantable Lead Implant Date: 20130501
Implantable Lead Location: 753860
Implantable Pulse Generator Implant Date: 20220322
Lead Channel Impedance Value: 551 Ohm
Lead Channel Impedance Value: 589 Ohm
Lead Channel Pacing Threshold Amplitude: 0.5 V
Lead Channel Pacing Threshold Pulse Width: 0.4 ms
Lead Channel Sensing Intrinsic Amplitude: 7.9 mV
Lead Channel Setting Pacing Amplitude: 1.5 V
Lead Channel Setting Pacing Pulse Width: 0.4 ms
Lead Channel Setting Sensing Sensitivity: 0.45 mV
Zone Setting Status: 755011

## 2022-11-17 ENCOUNTER — Other Ambulatory Visit: Payer: Self-pay | Admitting: Cardiology

## 2022-11-26 ENCOUNTER — Ambulatory Visit
Admission: RE | Admit: 2022-11-26 | Discharge: 2022-11-26 | Disposition: A | Discharge status: Home / Self Care | Payer: Managed Care, Other (non HMO) | Source: Ambulatory Visit | Attending: Urgent Care | Admitting: Urgent Care

## 2022-11-26 VITALS — BP 112/75 | HR 88 | Temp 98.9°F | Resp 16

## 2022-11-26 DIAGNOSIS — J209 Acute bronchitis, unspecified: Secondary | ICD-10-CM

## 2022-11-26 DIAGNOSIS — B9689 Other specified bacterial agents as the cause of diseases classified elsewhere: Secondary | ICD-10-CM | POA: Diagnosis not present

## 2022-11-26 DIAGNOSIS — J019 Acute sinusitis, unspecified: Secondary | ICD-10-CM

## 2022-11-26 MED ORDER — BENZONATATE 100 MG PO CAPS: ORAL_CAPSULE | ORAL | 0 refills | Status: DC

## 2022-11-26 MED ORDER — AMOXICILLIN-POT CLAVULANATE 875-125 MG PO TABS: 1.0000 | ORAL_TABLET | Freq: Two times a day (BID) | ORAL | 0 refills | Status: DC

## 2022-11-26 MED ORDER — PREDNISONE 20 MG PO TABS: ORAL_TABLET | ORAL | 0 refills | Status: AC

## 2022-11-26 NOTE — Discharge Instructions (Addendum)
Recommend cold/cough medication containing a cough suppressant such as dextromethorphan, as needed.  I have prescribed benzonatate, a cough suppressant.  Saline mist spray is helpful for removing excess mucus from your nose.  Room humidifiers are helpful to ease breathing at night. I recommend guaifenesin (Mucinex) with plenty of water throughout the day to help thin and loosen mucus secretions in your respiratory passages.      If appropriate based upon your other medical problems, you might also find relief of nasal/sinus congestion symptoms by using a nasal decongestant such as fluticasone (Flonase ) or pseudoephedrine (Sudafed sinus).  You will need to obtain Sudafed from behind the pharmacist counter.  Speak to the pharmacist to verify that you are not duplicating medications with other over-the-counter formulations that you may be using.

## 2022-11-26 NOTE — ED Triage Notes (Signed)
Pt. Presents to UC w/ c/o sinus pressure above the eyes and a productive cough for the past 3 weeks.

## 2022-11-26 NOTE — ED Provider Notes (Signed)
Christine Fitzpatrick    CSN: [id number] Arrival date & time: 11/26/22  0840      History   Chief Complaint Chief Complaint  Patient presents with   Cough    Also feel like I have a sinus infection. Pressure and pain above eyes - Entered by patient   Facial Pain    HPI Christine Fitzpatrick is a 67 y.o. female.    Cough   Patient presents to urgent care with symptoms x 3 weeks.  She endorses sinus pressure above her eyes as well as productive cough.  Endorses headache, dizziness, ears clogged.  She has a TM tube in the left ear placed by ENT to treat left eustachian tube dysfunction.  Past Medical History:  Diagnosis Date   Hyperlipidemia    Hypertension     Patient Active Problem List   Diagnosis Date Noted   Arthritis of both knees 07/05/2022   Back complaints 07/05/2022   Gastritis 07/05/2022   Hyperlipidemia 06/07/2022   Flank pain 04/17/2022   Annual physical exam 11/21/2021   Statin intolerance 11/21/2021   Primary osteoarthritis of left knee 06/14/2021   History of cardiac arrest 06/06/2021   Ventricular tachycardia ([place]) 06/06/2021   Supraventricular tachycardia 06/06/2021   Prediabetes 03/31/2021   Patent pressure equalization (PE) tube 02/25/2021   Status post intraocular lens implant 02/25/2021   Elevated hemoglobin A1c 01/24/2021   Bursitis of both hips 12/07/2020   Simple renal cyst 04/15/2019   Axillary lymphadenopathy 09/06/2017   Hx of adenomatous colonic polyps 06/01/2017   Internal hemorrhoid 06/01/2017   Syncope 06/02/2016   Lumpy breasts, right 05/06/2016   Neck stiffness 11/19/2015   Neck pain 10/22/2015   Presence of automatic (implantable) cardiac defibrillator 09/23/2014   Impaired fasting glucose 08/23/2014   Sciatica 06/19/2013   Aspiration pneumonia ([place]) 03/05/2012   Cardiac arrest ([place]) 02/29/2012   Ventricular fibrillation ([place]) 02/29/2012   Other tear of cartilage or meniscus of knee, current 2011/07/04   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      Home Medications    Prior to Admission medications   Medication Sig Start Date End Date Taking? Authorizing Provider  acetaminophen (TYLENOL) 500 MG tablet Take 500-1,000 mg by mouth every 6 (six) hours as needed (pain.).    [provider]  azelastine (ASTELIN) 0.1 % nasal spray Place 2 sprays into both nostrils in the morning. Use in each nostril as directed    [provider]  B Complex-C (B-COMPLEX WITH VITAMIN C) tablet Take 1 tablet by mouth daily in the afternoon.    [provider]  CALCIUM PO Take 1 tablet by mouth daily in the afternoon.    [provider]  cetirizine (ZYRTEC) 10 MG tablet Take 10 mg by mouth daily as needed for allergies.    [provider]  Cholecalciferol (VITAMIN D3) 125 MCG (5000 UT) TABS Take 5,000 Units by mouth  daily in the afternoon.    [provider]  Evolocumab (REPATHA) 140 MG/ML SOSY INJECT THE CONTENTS OF ONE SYRINGE INTO THE SKIN EVERY 14 DAYS. 09/11/22   Cletis Athens, MD  fluconazole (DIFLUCAN) 150 MG tablet Take 1 tablet (150 mg total) by mouth once a week. Patient taking differently: Take 150 mg by mouth every Friday. 07/05/22   McDonald, Stephan Minister, DPM  meloxicam (MOBIC) 15 MG tablet Take 15 mg by mouth every morning. Patient not taking: Reported on 10/04/2022 08/03/22   [provider]  metoprolol succinate (TOPROL-XL) 25 MG 24 hr tablet TAKE 1  TABLET (25 MG TOTAL) BY MOUTH DAILY. 11/17/22   Vickie Epley, MD    Family History Family History  Problem Relation Age of Onset   Hypertension Mother    Hypertension Maternal Aunt    Diabetes Maternal Grandmother     Social History Social History   Tobacco Use   Smoking status: Never   Smokeless tobacco: Never  Substance Use Topics   Alcohol use: Yes   Drug use: Never     Allergies   Codeine, Morphine and related, Sulfa antibiotics, and Sulfamethoxazole-trimethoprim   Review of Systems Review of Systems  Respiratory:  Positive for cough.      Physical Exam Triage Vital Signs ED Triage Vitals  Enc Vitals Group     BP 11/26/22 0846 112/75     Pulse Rate 11/26/22 0846 88     Resp 11/26/22 0846 16     Temp 11/26/22 0846 98.9 F (37.2 C)     Temp Source 11/26/22 0846 Oral     SpO2 11/26/22 0846 97 %     Weight --      Height --      Head Circumference --      Peak Flow --      Pain Score 11/26/22 0848 0     Pain Loc --      Pain Edu? --      Excl. in [place]? --    No data found.  Updated Vital Signs BP 112/75 (BP Location: Left Arm)   Pulse 88   Temp 98.9 F (37.2 C) (Oral)   Resp 16   SpO2 97%   Visual Acuity Right Eye Distance:   Left Eye Distance:   Bilateral Distance:    Right Eye Near:   Left Eye Near:    Bilateral Near:     Physical Exam Vitals reviewed.  Constitutional:      Appearance: Normal appearance.  HENT:     Ears:     Comments: TM tube in left ear. Cardiovascular:     Rate and Rhythm: Normal rate and regular rhythm.     Pulses: Normal pulses.     Heart sounds: Normal heart sounds.  Pulmonary:     Effort: Pulmonary effort is normal.     Breath sounds: Normal breath sounds.  Skin:    General: Skin is warm and dry.  Neurological:     General: No focal deficit present.     Mental Status: She is alert and oriented to person, place, and time.  Psychiatric:        Mood and Affect: Mood normal.        Behavior: Behavior  normal.      UC Treatments / Results  Labs (all labs ordered are listed, but only abnormal results are displayed) Labs Reviewed - No data to display  EKG   Radiology No results found.  Procedures Procedures (including critical care time)  Medications Ordered in UC Medications - No data to display  Initial Impression / Assessment and Plan / UC Course  I have reviewed the triage vital signs and the nursing notes.  Pertinent labs & imaging results that were available during my care of the patient were reviewed by me and considered in my medical decision making (see chart for details).   Patient is afebrile here without recent antipyretics. Satting well on room air. Overall is well appearing, well hydrated, without respiratory distress. Pulmonary exam is unremarkable.  Lungs CTAB without wheezing, rhonchi, rales.  TMs are WNL bilaterally other than a TM tube in the left ear.  Given duration of symptoms, likely secondary bacterial sinusitis and will treat with Augmentin.  Given the severity of her sinus symptoms, also will prescribe a course of prednisone which she states she has tolerated in the past.  Final Clinical Impressions(s) / UC Diagnoses   Final diagnoses:  None   Discharge Instructions   None    ED Prescriptions   None    PDMP not reviewed this encounter.   Rose Phi, FNP 11/26/22 0900

## 2022-12-04 ENCOUNTER — Other Ambulatory Visit: Payer: Self-pay | Admitting: Podiatry

## 2022-12-07 NOTE — Progress Notes (Signed)
Remote ICD transmission.   

## 2022-12-15 ENCOUNTER — Other Ambulatory Visit: Payer: Self-pay | Admitting: Internal Medicine

## 2022-12-15 DIAGNOSIS — Z1231 Encounter for screening mammogram for malignant neoplasm of breast: Secondary | ICD-10-CM

## 2022-12-26 ENCOUNTER — Telehealth: Payer: Self-pay | Admitting: *Deleted

## 2022-12-26 ENCOUNTER — Telehealth: Payer: Self-pay | Admitting: Cardiology

## 2022-12-26 NOTE — Telephone Encounter (Signed)
1st attempt to reach pt regarding surgical clearance and the need for a tele visit, left a message for pt to call back and ask for the preop team.

## 2022-12-26 NOTE — Telephone Encounter (Signed)
Pt has been scheduled a tele visit, 03/19/23 2:00.  Consent on file / medications reconciled.

## 2022-12-26 NOTE — Telephone Encounter (Signed)
   Pre-operative Risk Assessment    Patient Name: Christine Fitzpatrick  DOB: September 21, 1956 MRN: [id number]      Request for Surgical Clearance    Procedure:   Bunion correction left foot with a possible aiken osteotomy  Date of Surgery:  Clearance 04/13/23                                 Surgeon:  Dr. Lanae Crumbly Surgeon's Group or Practice Name:  [facility]  Phone number:  [phone] Fax number:  [phone]   Type of Clearance Requested:  TBD by cardiology   Type of Anesthesia:  anesthesiologist choice   Additional requests/questions:    SignedTrilby Drummer   12/26/2022, 10:59 AM

## 2022-12-26 NOTE — Telephone Encounter (Signed)
   Name: Christine Fitzpatrick  DOB: 1956/05/17  MRN: [id number]  Primary Cardiologist: None   Preoperative team, please contact this patient and set up a phone call appointment for further preoperative risk assessment. Please obtain consent and complete medication review. Thank you for your help.   Mayra Reel, NP 12/26/2022, 11:25 AM [place]

## 2022-12-26 NOTE — Telephone Encounter (Signed)
Pt has been scheduled a tele visit, 03/19/23 2:00.  Consent on file / medications reconciled.     Patient Consent for Virtual Visit        Christine Fitzpatrick has provided verbal consent on 12/26/2022 for a virtual visit (video or telephone).   CONSENT FOR VIRTUAL VISIT FOR:  Christine Fitzpatrick  By participating in this virtual visit I agree to the following:  I hereby voluntarily request, consent and authorize [facility] and its employed or contracted physicians, physician assistants, nurse practitioners or other licensed health care professionals (the Practitioner), to provide me with telemedicine health care services (the "Services") as deemed necessary by the treating Practitioner. I acknowledge and consent to receive the Services by the Practitioner via telemedicine. I understand that the telemedicine visit will involve communicating with the Practitioner through live audiovisual communication technology and the disclosure of certain medical information by electronic transmission. I acknowledge that I have been given the opportunity to request an in-person assessment or other available alternative prior to the telemedicine visit and am voluntarily participating in the telemedicine visit.  I understand that I have the right to withhold or withdraw my consent to the use of telemedicine in the course of my care at any time, without affecting my right to future care or treatment, and that the Practitioner or I may terminate the telemedicine visit at any time. I understand that I have the right to inspect all information obtained and/or recorded in the course of the telemedicine visit and may receive copies of available information for a reasonable fee.  I understand that some of the potential risks of receiving the Services via telemedicine include:  Delay or interruption in medical evaluation due to technological equipment failure or disruption; Information transmitted may not be sufficient (e.g. poor  resolution of images) to allow for appropriate medical decision making by the Practitioner; and/or  In rare instances, security protocols could fail, causing a breach of personal health information.  Furthermore, I acknowledge that it is my responsibility to provide information about my medical history, conditions and care that is complete and accurate to the best of my ability. I acknowledge that Practitioner's advice, recommendations, and/or decision may be based on factors not within their control, such as incomplete or inaccurate data provided by me or distortions of diagnostic images or specimens that may result from electronic transmissions. I understand that the practice of medicine is not an exact science and that Practitioner makes no warranties or guarantees regarding treatment outcomes. I acknowledge that a copy of this consent can be made available to me via my patient portal ([place]), or I can request a printed copy by calling the office of [facility].    I understand that my insurance will be billed for this visit.   I have read or had this consent read to me. I understand the contents of this consent, which adequately explains the benefits and risks of the Services being provided via telemedicine.  I have been provided ample opportunity to ask questions regarding this consent and the Services and have had my questions answered to my satisfaction. I give my informed consent for the services to be provided through the use of telemedicine in my medical care

## 2023-01-01 ENCOUNTER — Encounter: Payer: Self-pay | Admitting: Podiatry

## 2023-01-02 MED ORDER — EFINACONAZOLE 10 % EX SOLN: 1.0000 [drp] | Freq: Every day | CUTANEOUS | 11 refills | Status: DC

## 2023-01-17 ENCOUNTER — Other Ambulatory Visit: Payer: Self-pay | Admitting: Orthopedic Surgery

## 2023-01-17 DIAGNOSIS — M25562 Pain in left knee: Secondary | ICD-10-CM

## 2023-01-25 ENCOUNTER — Ambulatory Visit
Admission: RE | Admit: 2023-01-25 | Discharge: 2023-01-25 | Disposition: A | Discharge status: Home / Self Care | Payer: Managed Care, Other (non HMO) | Source: Ambulatory Visit | Attending: Internal Medicine | Admitting: Internal Medicine

## 2023-01-25 DIAGNOSIS — Z1231 Encounter for screening mammogram for malignant neoplasm of breast: Secondary | ICD-10-CM | POA: Insufficient documentation

## 2023-02-07 ENCOUNTER — Ambulatory Visit (INDEPENDENT_AMBULATORY_CARE_PROVIDER_SITE_OTHER): Payer: Managed Care, Other (non HMO) | Admitting: Urology

## 2023-02-07 VITALS — BP 138/75 | HR 99 | Ht 68.0 in | Wt 193.0 lb

## 2023-02-07 DIAGNOSIS — R350 Frequency of micturition: Secondary | ICD-10-CM | POA: Diagnosis not present

## 2023-02-07 DIAGNOSIS — R3915 Urgency of urination: Secondary | ICD-10-CM | POA: Diagnosis not present

## 2023-02-07 DIAGNOSIS — R109 Unspecified abdominal pain: Secondary | ICD-10-CM | POA: Diagnosis not present

## 2023-02-07 DIAGNOSIS — R300 Dysuria: Secondary | ICD-10-CM

## 2023-02-07 DIAGNOSIS — N949 Unspecified condition associated with female genital organs and menstrual cycle: Secondary | ICD-10-CM

## 2023-02-07 DIAGNOSIS — Z87440 Personal history of urinary (tract) infections: Secondary | ICD-10-CM

## 2023-02-07 LAB — MICROSCOPIC EXAMINATION

## 2023-02-07 LAB — URINALYSIS, COMPLETE
Bilirubin, UA: NEGATIVE
Glucose, UA: NEGATIVE
Ketones, UA: NEGATIVE
Leukocytes,UA: NEGATIVE
Nitrite, UA: NEGATIVE
Protein,UA: NEGATIVE
RBC, UA: NEGATIVE
Specific Gravity, UA: 1.025 (ref 1.005–1.030)
Urobilinogen, Ur: 0.2 mg/dL (ref 0.2–1.0)
pH, UA: 5 (ref 5.0–7.5)

## 2023-02-07 LAB — BLADDER SCAN AMB NON-IMAGING: Scan Result: 0

## 2023-02-07 MED ORDER — ESTRADIOL 0.1 MG/GM VA CREA: TOPICAL_CREAM | VAGINAL | 12 refills | Status: DC

## 2023-02-07 NOTE — Progress Notes (Signed)
Haze Rushing Plume,acting as a scribe for Hollice Espy, MD.,have documented all relevant documentation on the behalf of Hollice Espy, MD,as directed by  Hollice Espy, MD while in the presence of Hollice Espy, MD.  02/05/2023 9:47 AM   [name] 1956-01-05 [id number]  Referring provider: Cletis Athens, MD [address] [place],  [place] [address]  Chief Complaint  Patient presents with   New Patient (Initial Visit)    HPI: 66 yer-old female referred for further evaluation of possible recurrent UTI's and right flank pain.    She was actually referred almost a year ago for the same issue. She had a renal ultrasound in 04/27/2022, which was unremarkable without any upper tract pathology. Review of documentation indicates that she has had one documented urinary tract infection in 01/2022 with E.Coli. She did also have a somewhat suspicious point of care urinalysis on 07/31/2022, though no associated urine culture was found.  Today, she reports urinary urgency frequency and dysuria. She notes that a burning sensation that continues even after she voids and is primarily external. She also reports right back and hip pain for a year with worsening severity over the last 2 months along with lower abdominal and genital pain.  She points to her right lower back/sacroiliac joint as the source of the pain and discomfort.  She is also experiencing nausea. She denies any vaginal discharge.   She has a personal history of arthritis.   Results for orders placed or performed in visit on 02/07/23  BLADDER SCAN AMB NON-IMAGING  Result Value Ref Range   Scan Result 0 ml       PMH: Past Medical History:  Diagnosis Date   Hyperlipidemia    Hypertension     Surgical History: Past Surgical History:  Procedure Laterality Date   BREAST EXCISIONAL BIOPSY     CARDIAC DEFIBRILLATOR PLACEMENT     CATARACT EXTRACTION Left    MIDDLE EAR SURGERY Left    SVT ABLATION N/A 07/18/2022   Procedure: SVT  ABLATION;  Surgeon: Vickie Epley, MD;  Location: [facility];  Service: Cardiovascular;  Laterality: N/A;    Home Medications:  Allergies as of 02/07/2023       Reactions   Codeine Hives, Itching, Rash   Morphine And Related Hives, Itching, Rash   Sulfa Antibiotics Hives, Itching, Rash   Sulfamethoxazole-trimethoprim Rash        Medication List        Accurate as of February 07, 2023  9:47 AM. If you have any questions, ask your nurse or doctor.          acetaminophen 500 MG tablet Commonly known as: TYLENOL Take 500-1,000 mg by mouth every 6 (six) hours as needed (pain.).   azelastine 0.1 % nasal spray Commonly known as: ASTELIN Place 2 sprays into both nostrils in the morning. Use in each nostril as directed   B-complex with vitamin C tablet Take 1 tablet by mouth daily in the afternoon.   cetirizine 10 MG tablet Commonly known as: ZYRTEC Take 10 mg by mouth daily as needed for allergies.   Efinaconazole 10 % Soln Apply 1 drop topically daily.   estradiol 0.1 MG/GM vaginal cream Commonly known as: ESTRACE Discard applicator Apply pea sized amount to tip of finger to urethra before bed. Wash hands well after application. Use Monday, Wednesday and Friday   fluconazole 150 MG tablet Commonly known as: DIFLUCAN TAKE 1 TABLET BY MOUTH ONE TIME PER WEEK  meloxicam 15 MG tablet Commonly known as: MOBIC Take 15 mg by mouth as needed for pain.   metoprolol succinate 25 MG 24 hr tablet Commonly known as: TOPROL-XL TAKE 1 TABLET (25 MG TOTAL) BY MOUTH DAILY.   Repatha 140 MG/ML Sosy Generic drug: Evolocumab INJECT THE CONTENTS OF ONE SYRINGE INTO THE SKIN EVERY 14 DAYS.   Vitamin D3 125 MCG (5000 UT) Tabs Generic drug: Cholecalciferol Take 5,000 Units by mouth daily in the afternoon.        Allergies:  Allergies  Allergen Reactions   Codeine Hives, Itching and Rash   Morphine And Related Hives, Itching and Rash   Sulfa Antibiotics Hives,  Itching and Rash   Sulfamethoxazole-Trimethoprim Rash    Family History: Family History  Problem Relation Age of Onset   Hypertension Mother    Hypertension Maternal Aunt    Diabetes Maternal Grandmother    Breast cancer Neg Hx     Social History:  reports that she has never smoked. She has never used smokeless tobacco. She reports current alcohol use. She reports that she does not use drugs.   Physical Exam: BP 138/75   Pulse 99   Ht 5\' 8"  (1.727 m)   Wt 193 lb (87.5 kg)   BMI 29.35 kg/m   Constitutional:  Alert and oriented, No acute distress. HEENT: [place] AT, moist mucus membranes.  Trachea midline, no masses. Neurologic: Grossly intact, no focal deficits, moving all 4 extremities. Psychiatric: Normal mood and affect.  Pertinent Imaging:  EXAM: RENAL / URINARY TRACT ULTRASOUND COMPLETE   COMPARISON:  None Available.   FINDINGS: Right Kidney:   Renal measurements: 10.8 x 3.7 x 5.0 cm = volume: 103 mL. Echogenicity within normal limits. No suspicious mass or hydronephrosis visualized. There is a benign exophytic anechoic mass with posterior acoustic enhancement in the superior pole consistent with a benign cyst (for which no dedicated imaging follow-up is recommended) . It measures 3.2 x 2.9 x 2.9 cm.   Left Kidney:   Renal measurements: 10.4 x 5.2 x 4.3 cm = volume: 121 mL. Echogenicity within normal limits. No mass or hydronephrosis visualized.   Bladder:   Appears normal for degree of bladder distention.   Other:   None.   IMPRESSION: No suspicious sonographic etiology for flank pain is identified. If persistent concern, recommend dedicated cross-sectional imaging.     Electronically Signed   By: Valentino Saxon M.D.   On: 04/27/2022 17:08  This was personally reviewed and I agree with the radiologic interpretation.    Assessment & Plan:    1. Right flank pain - Unlikely to be genitourinary in origin based on the location, nature, and  negative renal imaging, as well as chronicity. Suspect musculoskeletal cause. The pain is localized over the sacroiliac joint, suggesting arthritis rather than renal pathology.  2. Vaginal/ Periurethral burning - UA today was negative - Suspicious for atrophic vaginitis. We discussed the pathophysiology of this condition. No evidence of true recurrent urinary tract infections. No infection present today, although patient reports burning.  -Recommended a pea-sized amount of topical estrogen cream applied to the affected area three times a week for the next six weeks. If burning does not subside in ~6 weeks, a return for pelvic exam is advised. The patient agrees with this plan.  Return if symptoms worsen or fail to improve.  I have reviewed the above documentation for accuracy and completeness, and I agree with the above.   Hollice Espy, MD  [facility] [address] [place], [place] [address] [phone]

## 2023-02-09 ENCOUNTER — Ambulatory Visit
Admission: RE | Admit: 2023-02-09 | Discharge: 2023-02-09 | Disposition: A | Discharge status: Home / Self Care | Payer: Managed Care, Other (non HMO) | Source: Ambulatory Visit | Attending: Orthopedic Surgery | Admitting: Orthopedic Surgery

## 2023-02-09 DIAGNOSIS — M25562 Pain in left knee: Secondary | ICD-10-CM | POA: Insufficient documentation

## 2023-02-09 MED ORDER — LIDOCAINE HCL (PF) 1 % IJ SOLN
10.0000 mL | Freq: Once | INTRAMUSCULAR | Status: AC
  Administered 2023-02-09: 5 mL via INTRADERMAL
  Filled 2023-02-09: qty 10

## 2023-02-09 MED ORDER — IOHEXOL 180 MG/ML  SOLN
20.0000 mL | Freq: Once | INTRAMUSCULAR | Status: AC | PRN
  Administered 2023-02-09: 15 mL

## 2023-02-09 MED ORDER — SODIUM CHLORIDE (PF) 0.9 % IJ SOLN
20.0000 mL | INTRAMUSCULAR | Status: DC | PRN
  Administered 2023-02-09: 5 mL

## 2023-02-12 ENCOUNTER — Other Ambulatory Visit: Payer: Self-pay | Admitting: Orthopedic Surgery

## 2023-02-12 DIAGNOSIS — M25562 Pain in left knee: Secondary | ICD-10-CM

## 2023-02-14 ENCOUNTER — Ambulatory Visit (INDEPENDENT_AMBULATORY_CARE_PROVIDER_SITE_OTHER): Payer: Managed Care, Other (non HMO)

## 2023-02-14 DIAGNOSIS — I469 Cardiac arrest, cause unspecified: Secondary | ICD-10-CM

## 2023-02-14 LAB — CUP PACEART REMOTE DEVICE CHECK
Battery Remaining Longevity: 144 mo
Battery Voltage: 3.03 V
Brady Statistic RV Percent Paced: 0.07 %
Date Time Interrogation Session: 20240407201035
HighPow Impedance: 37 Ohm
HighPow Impedance: 49 Ohm
Implantable Lead Connection Status: 753985
Implantable Lead Implant Date: 20130501
Implantable Lead Location: 753860
Implantable Pulse Generator Implant Date: 20220322
Lead Channel Impedance Value: 494 Ohm
Lead Channel Impedance Value: 551 Ohm
Lead Channel Pacing Threshold Amplitude: 0.5 V
Lead Channel Pacing Threshold Pulse Width: 0.4 ms
Lead Channel Sensing Intrinsic Amplitude: 6.6 mV
Lead Channel Setting Pacing Amplitude: 1.5 V
Lead Channel Setting Pacing Pulse Width: 0.4 ms
Lead Channel Setting Sensing Sensitivity: 0.45 mV
Zone Setting Status: 755011

## 2023-02-16 ENCOUNTER — Telehealth: Payer: Self-pay

## 2023-02-16 ENCOUNTER — Encounter: Payer: Self-pay | Admitting: Cardiology

## 2023-02-16 NOTE — Telephone Encounter (Signed)
Christine Fitzpatrick called to cancel her surgery with Dr. Lilian Kapur on 04/13/2023. She stated she needs to have knee surgery first. Notified Dr. Lilian Kapur and Aram Beecham with [facility]

## 2023-02-27 LAB — LAB REPORT - SCANNED
A1c: 5.5
EGFR: 69

## 2023-03-19 ENCOUNTER — Ambulatory Visit: Payer: Managed Care, Other (non HMO)

## 2023-03-22 ENCOUNTER — Encounter: Payer: Self-pay | Admitting: Urology

## 2023-03-23 NOTE — Progress Notes (Signed)
Remote ICD transmission.   

## 2023-03-26 ENCOUNTER — Ambulatory Visit: Payer: Managed Care, Other (non HMO) | Admitting: Family Medicine

## 2023-04-03 ENCOUNTER — Encounter: Payer: Self-pay | Admitting: Podiatry

## 2023-04-03 ENCOUNTER — Ambulatory Visit (INDEPENDENT_AMBULATORY_CARE_PROVIDER_SITE_OTHER): Payer: Managed Care, Other (non HMO) | Admitting: Family Medicine

## 2023-04-03 ENCOUNTER — Encounter: Payer: Self-pay | Admitting: Family Medicine

## 2023-04-03 ENCOUNTER — Encounter: Payer: Self-pay | Admitting: Cardiology

## 2023-04-03 VITALS — BP 132/82 | Ht 68.0 in | Wt 190.0 lb

## 2023-04-03 DIAGNOSIS — I472 Ventricular tachycardia, unspecified: Secondary | ICD-10-CM | POA: Diagnosis not present

## 2023-04-03 DIAGNOSIS — M170 Bilateral primary osteoarthritis of knee: Secondary | ICD-10-CM

## 2023-04-03 DIAGNOSIS — E785 Hyperlipidemia, unspecified: Secondary | ICD-10-CM | POA: Diagnosis not present

## 2023-04-03 DIAGNOSIS — I471 Supraventricular tachycardia, unspecified: Secondary | ICD-10-CM

## 2023-04-03 DIAGNOSIS — R7309 Other abnormal glucose: Secondary | ICD-10-CM

## 2023-04-03 DIAGNOSIS — I4901 Ventricular fibrillation: Secondary | ICD-10-CM | POA: Diagnosis not present

## 2023-04-03 DIAGNOSIS — Z1211 Encounter for screening for malignant neoplasm of colon: Secondary | ICD-10-CM

## 2023-04-03 DIAGNOSIS — R7303 Prediabetes: Secondary | ICD-10-CM

## 2023-04-03 DIAGNOSIS — Z1231 Encounter for screening mammogram for malignant neoplasm of breast: Secondary | ICD-10-CM

## 2023-04-03 DIAGNOSIS — N952 Postmenopausal atrophic vaginitis: Secondary | ICD-10-CM

## 2023-04-03 DIAGNOSIS — Z789 Other specified health status: Secondary | ICD-10-CM

## 2023-04-03 DIAGNOSIS — Z780 Asymptomatic menopausal state: Secondary | ICD-10-CM

## 2023-04-03 NOTE — Progress Notes (Signed)
   SUBJECTIVE:   Chief Complaint  Patient presents with  . Establish Care   HPI Presents to clinic to establish care. No acute concerns today.   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  Constitutional:      General: She is not in acute distress.    Appearance: Normal appearance. She is not ill-appearing.  HENT:     Head: Normocephalic.     Right Ear: Tympanic membrane, ear canal and external ear normal.     Left Ear: Tympanic membrane, ear canal and external ear normal.     Nose: Nose normal.     Mouth/Throat:     Mouth: Mucous membranes are moist.  Eyes:     Extraocular Movements: Extraocular movements intact.     Conjunctiva/sclera: Conjunctivae normal.     Pupils: Pupils are equal, round, and reactive to light.  Neck:     Vascular: No carotid bruit.  Cardiovascular:     Rate and Rhythm: Normal rate and regular rhythm.     Pulses: Normal pulses.     Heart sounds: Normal heart sounds.  Pulmonary:     Effort: Pulmonary effort is normal.     Breath sounds: Normal breath sounds.  Abdominal:     General: Bowel sounds are normal. There is no distension.     Palpations: Abdomen is soft.     Tenderness: There is no abdominal tenderness. There is no right CVA tenderness, left CVA tenderness, guarding or rebound.  Musculoskeletal:        General: Normal range of motion.     Cervical back: Normal range of motion.     Right lower leg: No edema.     Left lower leg: No edema.  Lymphadenopathy:     Cervical: No cervical adenopathy.  Skin:    Capillary Refill: Capillary refill takes less than 2 seconds.  Neurological:     General: No focal deficit present.     Mental Status: She is alert and oriented to person, place, and  time. Mental status is at baseline.     Motor: No weakness.  Psychiatric:        Mood and Affect: Mood normal.        Behavior: Behavior normal.        Thought Content: Thought content normal.        Judgment: Judgment normal.    ASSESSMENT/PLAN:  Breast cancer screening by mammogram  Abnormal glucose  Postmenopausal estrogen deficiency  Hyperlipidemia, unspecified hyperlipidemia type  Morbid obesity ([name])  HCM Mammogram up-to-date.  Due 03/25 Colonoscopy up-to-date.  Due 11/2029 Tetanus up-to-date Shingles up-to-date Hepatitis C/HIV screening up-to-date Pap smear not up-to-date.  No previous history of abnormal cervical cancer screening or HPV.  Patient schedule appointment for continued cervical cancer screening.  If negative can discontinue given patient will have aged out of screening.  PDMP reviewed  Return for PCP.  Dana Allan, MD

## 2023-04-03 NOTE — Patient Instructions (Addendum)
It was a pleasure meeting you today. Thank you for allowing me to take part in your health care.  Our goals for today as we discussed include:  Will get records from previous colonoscopy and PAP  Schedule PAP in near future  Recommend Bone scan at 67 years.  If you have any questions or concerns, please do not hesitate to call the office at [phone].  I look forward to our next visit and until then take care and stay safe.  Regards,   Dana Allan, MD   [facility]

## 2023-04-04 ENCOUNTER — Encounter: Payer: Self-pay | Admitting: Family Medicine

## 2023-04-04 ENCOUNTER — Other Ambulatory Visit: Payer: Self-pay | Admitting: Family Medicine

## 2023-04-05 ENCOUNTER — Other Ambulatory Visit (HOSPITAL_COMMUNITY)
Admission: RE | Admit: 2023-04-05 | Discharge: 2023-04-05 | Disposition: A | Discharge status: Home / Self Care | Payer: Managed Care, Other (non HMO) | Source: Ambulatory Visit | Attending: Family Medicine | Admitting: Family Medicine

## 2023-04-05 ENCOUNTER — Encounter: Payer: Self-pay | Admitting: Family Medicine

## 2023-04-05 ENCOUNTER — Ambulatory Visit (INDEPENDENT_AMBULATORY_CARE_PROVIDER_SITE_OTHER): Payer: Managed Care, Other (non HMO) | Admitting: Family Medicine

## 2023-04-05 VITALS — BP 128/78 | HR 80 | Ht 68.0 in | Wt 186.0 lb

## 2023-04-05 DIAGNOSIS — Z01419 Encounter for gynecological examination (general) (routine) without abnormal findings: Secondary | ICD-10-CM | POA: Insufficient documentation

## 2023-04-05 DIAGNOSIS — R7989 Other specified abnormal findings of blood chemistry: Secondary | ICD-10-CM

## 2023-04-05 LAB — VITAMIN D 25 HYDROXY (VIT D DEFICIENCY, FRACTURES): VITD: 43.61 ng/mL (ref 30.00–100.00)

## 2023-04-05 NOTE — Progress Notes (Signed)
   SUBJECTIVE:   Chief Complaint  Patient presents with   Medical Management of Chronic Issues   HPI  Present to clinic for annual Pap Sexually active with husband.  Monogamous relationship.  Endorses dyspareunia and vaginal dryness.  Currently using estradiol cream.  Denies any vaginal discharge, vaginal bleeding or dysuria.   PERTINENT PMH / PSH: Postmenopausal Atrophic vaginitis   OBJECTIVE:  BP 128/78   Pulse 80   Ht 5\' 8"  (1.727 m)   Wt 186 lb (84.4 kg)   SpO2 97%   BMI 28.28 kg/m    Physical Exam Exam conducted with a chaperone present.  Genitourinary:    General: Normal vulva.     Exam position: Lithotomy position.     Labia:        Right: No lesion.        Left: No lesion.      Vagina: Normal.     Cervix: Normal.     Adnexa: Right adnexa normal and left adnexa normal.     Rectum: Normal.     Comments: Retroverted uterus     ASSESSMENT/PLAN:  Encounter for cervical Pap smear with pelvic exam Assessment & Plan: UPT not indicated Postmenopausal  Pap smear: performed with HPV cotesting and genotyping No indication for STI testing No previous history of abnormal cervical cancer screening or HPV.  If current results negative can discontinue given patient will have aged out of screening before next recommended screening    Orders: -     Cytology - PAP  Low serum vitamin D Assessment & Plan: Currently on Vitamin D 5000 IU Check vitamin D level and low normal despite supplements Start Vitamin D 1.25 mg weekly   Orders: -     VITAMIN D 25 Hydroxy (Vit-D Deficiency, Fractures) -     Vitamin D (Ergocalciferol); Take 1 capsule (50,000 Units total) by mouth every 7 (seven) days.  Dispense: 12 capsule; Refill: 1   HCM Mammogram up-to-date.  Due 03/25.  Referral sent Dexa scan due at age 18.  Referral sent. Colonoscopy 06/01/2017, Dr Berniece Pap.  Adenomatous polys.  Recommended repeat 48yrs. Due 05/2022.  Referral has been sent Tetanus up-to-date Shingles  up-to-date Hepatitis C/HIV screening up-to-date  PDMP reviewed  Return if symptoms worsen or fail to improve.  Dana Allan, MD

## 2023-04-05 NOTE — Patient Instructions (Signed)
It was a pleasure meeting you today. Thank you for allowing me to take part in your health care.  Our goals for today as we discussed include:  Will MyChart you the results of PAP  today.    Celebrex has sulfa component that you are allergic to.,  Other medication is Ralfen. Again this has higher risk of GI bleeding, Heart attack, increase in blood pressure and recommendations would be to use lowest dose and sparingly.  Could try daily Tylenol 650 mg.  Can increase to 2-3 times a day.   If you have any questions or concerns, please do not hesitate to call the office at [phone].  I look forward to our next visit and until then take care and stay safe.  Regards,   Dana Allan, MD   [facility]

## 2023-04-08 ENCOUNTER — Encounter: Payer: Self-pay | Admitting: Family Medicine

## 2023-04-08 DIAGNOSIS — N952 Postmenopausal atrophic vaginitis: Secondary | ICD-10-CM | POA: Insufficient documentation

## 2023-04-08 DIAGNOSIS — Z1211 Encounter for screening for malignant neoplasm of colon: Secondary | ICD-10-CM | POA: Insufficient documentation

## 2023-04-08 DIAGNOSIS — Z1231 Encounter for screening mammogram for malignant neoplasm of breast: Secondary | ICD-10-CM

## 2023-04-08 DIAGNOSIS — Z780 Asymptomatic menopausal state: Secondary | ICD-10-CM | POA: Insufficient documentation

## 2023-04-08 DIAGNOSIS — R7989 Other specified abnormal findings of blood chemistry: Secondary | ICD-10-CM | POA: Insufficient documentation

## 2023-04-08 DIAGNOSIS — Z01419 Encounter for gynecological examination (general) (routine) without abnormal findings: Secondary | ICD-10-CM | POA: Insufficient documentation

## 2023-04-08 DIAGNOSIS — Z124 Encounter for screening for malignant neoplasm of cervix: Secondary | ICD-10-CM

## 2023-04-08 HISTORY — DX: Encounter for screening mammogram for malignant neoplasm of breast: Z12.31

## 2023-04-08 HISTORY — DX: Encounter for screening for malignant neoplasm of cervix: Z12.4

## 2023-04-08 MED ORDER — VITAMIN D (ERGOCALCIFEROL) 1.25 MG (50000 UNIT) PO CAPS
50000.0000 [IU] | ORAL_CAPSULE | ORAL | 1 refills | Status: DC
Start: 2023-04-08 — End: 2023-10-03

## 2023-04-08 NOTE — Assessment & Plan Note (Signed)
Previously tried [employer] and Lipitor.  Reports significant myalgias.

## 2023-04-08 NOTE — Assessment & Plan Note (Addendum)
Currently on Vitamin D 5000 IU Check vitamin D level and low normal despite supplements Start Vitamin D 1.25 mg weekly

## 2023-04-08 NOTE — Assessment & Plan Note (Signed)
Chronic.  Stable.  Asymptomatic. On metoprolol 25 mg daily Has AICD, interrogated every 3 months Follows with Dr. Sheria Lang yearly

## 2023-04-08 NOTE — Assessment & Plan Note (Signed)
Chronic.  Recent LDL 63, Triggs 147, total cholesterol 156 On Repatha 140 mg biweekly and tolerating well. Check lipids annually

## 2023-04-08 NOTE — Assessment & Plan Note (Signed)
Recent A1c 5.7. Check annually

## 2023-04-08 NOTE — Assessment & Plan Note (Signed)
Chronic.   Using estradiol cream 0.1mg /gm three times weekly, prescribed by Urology Could consider daily Veozah, non hormonal treatment in future given cardiac history

## 2023-04-08 NOTE — Assessment & Plan Note (Addendum)
Chronic.  Stable. On metoprolol 25 mg daily Has AICD, interrogated every 3 months

## 2023-04-08 NOTE — Assessment & Plan Note (Signed)
UPT not indicated Postmenopausal  Pap smear: performed with HPV cotesting and genotyping No indication for STI testing No previous history of abnormal cervical cancer screening or HPV.  If current results negative can discontinue given patient will have aged out of screening before next recommended screening

## 2023-04-08 NOTE — Assessment & Plan Note (Addendum)
>>  ASSESSMENT AND PLAN FOR VENTRICULAR FIBRILLATION ([name]) WRITTEN ON 04/08/2023  9:50 AM BY Caidence Kaseman, MD  History of V-fib leading to cardiac arrest with ROSC Stable.  Asymptomatic. On metoprolol  25 mg daily Has AICD, interrogated every 3 months Follows with Dr. Ole yearly   >>ASSESSMENT AND PLAN FOR VENTRICULAR TACHYCARDIA ([name]) WRITTEN ON 04/08/2023  9:50 AM BY Tris Howell, MD  Chronic.  Stable.  Asymptomatic. On metoprolol  25 mg daily Has AICD, interrogated every 3 months Follows with Dr. Ole yearly

## 2023-04-08 NOTE — Assessment & Plan Note (Signed)
Chronic.  Follows [facility]. Currently on prednisone as needed Also has meloxicam 15 mg for pain and takes sparingly. Recommend discontinuing meloxicam.  Unable to use Celebrex given sulfa allergies.  If needed would use Relafen.  Discussed increased risks of MI, hypertension, GI bleed if using medication regularly.  Would use sparingly and at lowest dose possible.  I advised her to reach out to her EP doctor to discuss switching medication. Recommend she use Tylenol 650 milligrams 3 times daily for arthritic pain to avoid use of NSAIDs.

## 2023-04-11 ENCOUNTER — Encounter: Payer: Self-pay | Admitting: Family Medicine

## 2023-04-11 LAB — CYTOLOGY - PAP
Comment: NEGATIVE
Diagnosis: NEGATIVE
High risk HPV: NEGATIVE

## 2023-04-18 ENCOUNTER — Encounter: Payer: Managed Care, Other (non HMO) | Admitting: Podiatry

## 2023-04-18 ENCOUNTER — Encounter: Payer: Self-pay | Admitting: *Deleted

## 2023-04-24 ENCOUNTER — Telehealth: Payer: Self-pay

## 2023-04-24 NOTE — Telephone Encounter (Signed)
Pt left vmm to schedule colonoscopy please return call 

## 2023-04-25 NOTE — Telephone Encounter (Signed)
Message left for patient to return my call.  

## 2023-04-30 NOTE — Telephone Encounter (Signed)
Message left for patient to return my call.  

## 2023-05-01 ENCOUNTER — Encounter: Payer: Managed Care, Other (non HMO) | Admitting: Podiatry

## 2023-05-02 ENCOUNTER — Telehealth: Payer: Self-pay | Admitting: *Deleted

## 2023-05-02 ENCOUNTER — Telehealth: Payer: Self-pay | Admitting: Cardiology

## 2023-05-02 ENCOUNTER — Other Ambulatory Visit: Payer: Self-pay | Admitting: *Deleted

## 2023-05-02 DIAGNOSIS — Z86010 Personal history of colonic polyps: Secondary | ICD-10-CM

## 2023-05-02 MED ORDER — NA SULFATE-K SULFATE-MG SULF 17.5-3.13-1.6 GM/177ML PO SOLN: 1.0000 | Freq: Once | ORAL | 0 refills | Status: AC

## 2023-05-02 NOTE — Telephone Encounter (Signed)
   Pre-operative Risk Assessment    Patient Name: Christine Fitzpatrick  DOB: Oct 31, 1956 MRN: [id number]      Request for Surgical Clearance    Procedure:   COLONOSCOPY  Date of Surgery:  Clearance TBD                                 Surgeon:   Surgeon's Group or Practice Name:  [facility] Phone number:  [phone] Fax number:  [phone]   Type of Clearance Requested:   - Medical    Type of Anesthesia:  General    Additional requests/questions:    Wilhemina Cash   05/02/2023, 9:50 AM

## 2023-05-02 NOTE — Telephone Encounter (Signed)
Tele pre op appt 05/28/23. Med rec and consent are done.      Patient Consent for Virtual Visit        Christine Fitzpatrick has provided verbal consent on 05/02/2023 for a virtual visit (video or telephone).   CONSENT FOR VIRTUAL VISIT FOR:  Christine Fitzpatrick  By participating in this virtual visit I agree to the following:  I hereby voluntarily request, consent and authorize [facility] and its employed or contracted physicians, physician assistants, nurse practitioners or other licensed health care professionals (the Practitioner), to provide me with telemedicine health care services (the "Services") as deemed necessary by the treating Practitioner. I acknowledge and consent to receive the Services by the Practitioner via telemedicine. I understand that the telemedicine visit will involve communicating with the Practitioner through live audiovisual communication technology and the disclosure of certain medical information by electronic transmission. I acknowledge that I have been given the opportunity to request an in-person assessment or other available alternative prior to the telemedicine visit and am voluntarily participating in the telemedicine visit.  I understand that I have the right to withhold or withdraw my consent to the use of telemedicine in the course of my care at any time, without affecting my right to future care or treatment, and that the Practitioner or I may terminate the telemedicine visit at any time. I understand that I have the right to inspect all information obtained and/or recorded in the course of the telemedicine visit and may receive copies of available information for a reasonable fee.  I understand that some of the potential risks of receiving the Services via telemedicine include:  Delay or interruption in medical evaluation due to technological equipment failure or disruption; Information transmitted may not be sufficient (e.g. poor resolution of images) to allow for  appropriate medical decision making by the Practitioner; and/or  In rare instances, security protocols could fail, causing a breach of personal health information.  Furthermore, I acknowledge that it is my responsibility to provide information about my medical history, conditions and care that is complete and accurate to the best of my ability. I acknowledge that Practitioner's advice, recommendations, and/or decision may be based on factors not within their control, such as incomplete or inaccurate data provided by me or distortions of diagnostic images or specimens that may result from electronic transmissions. I understand that the practice of medicine is not an exact science and that Practitioner makes no warranties or guarantees regarding treatment outcomes. I acknowledge that a copy of this consent can be made available to me via my patient portal [facility] MyChart), or I can request a printed copy by calling the office of [facility].    I understand that my insurance will be billed for this visit.   I have read or had this consent read to me. I understand the contents of this consent, which adequately explains the benefits and risks of the Services being provided via telemedicine.  I have been provided ample opportunity to ask questions regarding this consent and the Services and have had my questions answered to my satisfaction. I give my informed consent for the services to be provided through the use of telemedicine in my medical care

## 2023-05-02 NOTE — Telephone Encounter (Signed)
PT FOLLOWS DR. Steffanie Dunn. Pt has been scheduled for tele pre op appt 05/28/23 for upcoming colonoscopy. Pt tells me that colonoscopy is going to be done 06/20/23. Med rec and consent are done.   In speaking with the pt she also tells me that she is supposed to have surgery with [facility]. I stated that we have not yet received a clearance request from [facility]. I urged the pt to call [facility] and have them fax clearance request ASAP as that way we may be able to get both surgeries cleared for the pt with the one tele pre op appt. Pt is agreeable and thanked me for the help.

## 2023-05-02 NOTE — Telephone Encounter (Signed)
Pt called back to inform you that [facility] stated they sent over clearance request 03/08/23 and they will send it over again today for her surgery on 05/24/23

## 2023-05-02 NOTE — Telephone Encounter (Signed)
   Patient Name: Christine Fitzpatrick  DOB: 22-Jan-1956 MRN: [id number]  Primary Cardiologist: None  Chart reviewed as part of pre-operative protocol coverage.   Patient will receive clearance for 2 separate procedures on telemetry visit scheduled 05/14/2023.  She is scheduled for left arthroscopic lateral meniscectomy on 05/24/2023.  This will be with Dr. Lesleigh Noe at [facility]  The second procedure will be a colonoscopy procedure with [facility] that will be scheduled 06/20/2023.  Please grant clearance and seen separate approvals to each providers office.  Please see phone notes for further clarification.   [name], [name], NP 05/02/2023, 3:55 PM

## 2023-05-02 NOTE — Telephone Encounter (Signed)
I tried top reach the pt to schedule a tele pre op appt. Answering machine came on but I was not able to leave a message to call back.

## 2023-05-02 NOTE — Telephone Encounter (Signed)
   Pre-operative Risk Assessment    Patient Name: Christine Fitzpatrick  DOB: [birth year] MRN: [id number]  PT HAS 2 DIFFERENT SURGERIES WITH 2 DIFFERENT SURGEONS OFFICES. PT HAS TELE PRE OP APPT 05/14/23 TO CLEAR FOR BOTH SURGERIES     Request for Surgical Clearance    Procedure:   LEFT ARTHROSCOPIC LATERAL MENISECTOMY  Date of Surgery:  Clearance 05/24/23                                 Surgeon:  DR. Martha Clan Surgeon's Group or Practice Name:  [facility] Phone number:  [phone] ATTN: Christine Fitzpatrick Fax number:  [phone]   Type of Clearance Requested:   - Medical ; NO MEDICATIONS LISTED AS NEEDING TO BE HELD   Type of Anesthesia:  General    Additional requests/questions:    Christine Fitzpatrick   05/02/2023, 3:29 PM

## 2023-05-02 NOTE — Telephone Encounter (Signed)
Gastroenterology Pre-Procedure Review  Request Date: 06/20/2023 Requesting Physician: Dr.  Tobi Bastos  PATIENT REVIEW QUESTIONS: The patient responded to the following health history questions as indicated:    1. Are you having any GI issues? no 2. Do you have a personal history of Polyps? yes (around 2019) 3. Do you have a family history of Colon Cancer or Polyps? no 4. Diabetes Mellitus? no 5. Joint replacements in the past 12 months?no 6. Major health problems in the past 3 months?no 7. Any artificial heart valves, MVP, or defibrillator?yes (ventricular fibrillation and tachycardia)    MEDICATIONS & ALLERGIES:    Patient reports the following regarding taking any anticoagulation/antiplatelet therapy:   Plavix, Coumadin, Eliquis, Xarelto, Lovenox, Pradaxa, Brilinta, or Effient? no Aspirin? no  Patient confirms/reports the following medications:  Current Outpatient Medications  Medication Sig Dispense Refill   acetaminophen (TYLENOL) 500 MG tablet Take 500-1,000 mg by mouth every 6 (six) hours as needed (pain.).     azelastine (ASTELIN) 0.1 % nasal spray Place 2 sprays into both nostrils in the morning. Use in each nostril as directed     B Complex-C (B-COMPLEX WITH VITAMIN C) tablet Take 1 tablet by mouth daily in the afternoon.     cetirizine (ZYRTEC) 10 MG tablet Take 10 mg by mouth daily as needed for allergies.     Efinaconazole 10 % SOLN Apply 1 drop topically daily. 4 mL 11   estradiol (ESTRACE) 0.1 MG/GM vaginal cream Discard applicator Apply pea sized amount to tip of finger to urethra before bed. Wash hands well after application. Use Monday, Wednesday and Friday 42.5 g 12   Evolocumab (REPATHA) 140 MG/ML SOSY INJECT THE CONTENTS OF ONE SYRINGE INTO THE SKIN EVERY 14 DAYS. 6 mL 1   fluconazole (DIFLUCAN) 150 MG tablet TAKE 1 TABLET BY MOUTH ONE TIME PER WEEK 12 tablet 2   methylPREDNISolone (MEDROL DOSEPAK) 4 MG TBPK tablet Take 4 mg by mouth.     metoprolol succinate (TOPROL-XL)  25 MG 24 hr tablet TAKE 1 TABLET (25 MG TOTAL) BY MOUTH DAILY. 90 tablet 2   Vitamin D, Ergocalciferol, (DRISDOL) 1.25 MG (50000 UNIT) CAPS capsule Take 1 capsule (50,000 Units total) by mouth every 7 (seven) days. 12 capsule 1   No current facility-administered medications for this visit.    Patient confirms/reports the following allergies:  Allergies  Allergen Reactions   Codeine Hives, Itching and Rash   Morphine And Codeine Hives, Itching and Rash   Sulfa Antibiotics Hives, Itching and Rash   Sulfamethoxazole-Trimethoprim Rash    No orders of the defined types were placed in this encounter.   AUTHORIZATION INFORMATION Primary Insurance: 1D#: Group #:  Secondary Insurance: 1D#: Group #:  SCHEDULE INFORMATION: Date: 06/20/2023 Time: Location:  [facility]

## 2023-05-02 NOTE — Telephone Encounter (Signed)
Colonoscopy schedule for 06/20/2023 with Dr Tobi Bastos

## 2023-05-02 NOTE — Telephone Encounter (Signed)
PT HAS 2 DIFFERENT SURGERIES WITH 2 DIFFERENT SURGEONS OFFICES. PT HAS TELE PRE OP APPT 05/14/23 TO CLEAR FOR BOTH SURGERIES

## 2023-05-02 NOTE — Telephone Encounter (Signed)
   Name: Christine Fitzpatrick  DOB: [birth year]  MRN: [id number]  Primary Cardiologist: None   Preoperative team, please contact this patient and set up a phone call appointment for further preoperative risk assessment. Please obtain consent and complete medication review. Thank you for your help.  I confirm that guidance regarding antiplatelet and oral anticoagulation therapy has been completed and, if necessary, noted below.  None requested.   [name], [name], NP 05/02/2023, 9:56 AM [facility]

## 2023-05-12 NOTE — Progress Notes (Unsigned)
Virtual Visit via Telephone Note   Because of Christine Fitzpatrick co-morbid illnesses, she is at least at moderate risk for complications without adequate follow up.  This format is felt to be most appropriate for this patient at this time.  The patient did not have access to video technology/had technical difficulties with video requiring transitioning to audio format only (telephone).  All issues noted in this document were discussed and addressed.  No physical exam could be performed with this format.  Please refer to the patient's chart for her consent to telehealth for [facility].  Evaluation Performed:  Preoperative cardiovascular risk assessment _____________   Date:  05/12/2023   Patient ID:  Christine Fitzpatrick, DOB 1956/09/08, MRN [id number] Patient Location:  Home Provider location:   Office  Primary Care Provider:  Dana Allan, MD Primary Cardiologist:  None  Chief Complaint / Patient Profile   67 y.o. y/o female with a h/o SVT s/p EP study September 2023 with no inducible arrhythmia, V-fib arrest s/p ICD who is pending 2 separate procedures.  She is scheduled for a left arthroscopic lateral meniscectomy on 05/24/2023 with Dr. Lesleigh Noe and colonoscopy on 06/20/2023 with [facility].  She presents today for telephonic preoperative cardiovascular risk assessment.  History of Present Illness    Christine Fitzpatrick is a 67 y.o. female who presents via audio/video conferencing for a telehealth visit today.  Pt was last seen in cardiology clinic on 10/04/2022 by Dr. Lalla Brothers.  At that time Christine Fitzpatrick was doing well.  The patient is now pending procedure as outlined above. Since her last visit, she is doing well from a cardiac standpoint. Patient denies shortness of breath or dyspnea on exertion. No chest pain, pressure, or tightness. Denies lower extremity edema, orthopnea, or PND. She reports palpitations are stable and unchanged. She is active walking her dog daily for 20-30 minutes. She also  does light weight lifting every other day.   Past Medical History    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 03/29/2011   Past Surgical History:  Procedure Laterality Date   BREAST EXCISIONAL BIOPSY     CARDIAC DEFIBRILLATOR PLACEMENT     CATARACT EXTRACTION Left    MIDDLE EAR SURGERY Left    SVT ABLATION N/A 07/18/2022   Procedure: SVT ABLATION;  Surgeon: Lanier Prude, MD;  [facility]: [facility];  Service: Cardiovascular;  Laterality: N/A;    Allergies  Allergies  Allergen Reactions   Codeine Hives, Itching and Rash   Morphine And Codeine Hives, Itching and Rash   Sulfa Antibiotics Hives, Itching and Rash   Sulfamethoxazole-Trimethoprim Rash    Home Medications    Prior to Admission medications   Medication Sig Start Date End Date Taking? Authorizing Provider  acetaminophen (TYLENOL) 500 MG tablet Take 500-1,000 mg by mouth every 6 (six) hours as needed (pain.).    [provider]  azelastine (ASTELIN) 0.1 % nasal spray Place 2 sprays into both nostrils in the morning. Use in each nostril as directed    [provider]  B Complex-C (B-COMPLEX WITH VITAMIN C) tablet Take 1 tablet by mouth daily in the afternoon.    [provider]  cetirizine (ZYRTEC) 10 MG tablet Take 10 mg by mouth daily as needed for allergies.    [provider]  Efinaconazole 10 %  SOLN Apply 1 drop topically daily. 01/02/23   McDonald, Rachelle Hora, DPM  estradiol (ESTRACE) 0.1 MG/GM vaginal cream Discard applicator Apply pea sized amount to tip of finger to urethra before bed. Wash hands well after application. Use Monday, Wednesday and Friday 02/07/23   Vanna Scotland, MD  Evolocumab (REPATHA) 140 MG/ML SOSY INJECT THE CONTENTS OF ONE SYRINGE INTO THE SKIN EVERY 14 DAYS. 09/11/22   Corky Downs, MD   fluconazole (DIFLUCAN) 150 MG tablet TAKE 1 TABLET BY MOUTH ONE TIME PER WEEK 12/11/22   Edwin Cap, DPM  methylPREDNISolone (MEDROL DOSEPAK) 4 MG TBPK tablet Take 4 mg by mouth. 04/04/23   [provider]  metoprolol succinate (TOPROL-XL) 25 MG 24 hr tablet TAKE 1 TABLET (25 MG TOTAL) BY MOUTH DAILY. 11/17/22   Lanier Prude, MD  Vitamin D, Ergocalciferol, (DRISDOL) 1.25 MG (50000 UNIT) CAPS capsule Take 1 capsule (50,000 Units total) by mouth every 7 (seven) days. 04/08/23   Dana Allan, MD    Physical Exam    Vital Signs:  Mikea Sanclemente does not have vital signs available for review today.  Given telephonic nature of communication, physical exam is limited. AAOx3. NAD. Normal affect.  Speech and respirations are unlabored.  Accessory Clinical Findings    None  Assessment & Plan    Primary Cardiologist: None  Preoperative cardiovascular risk assessment.  Left arthroscopic lateral meniscectomy on 05/24/2023 with Dr. Lesleigh Noe.  Colonoscopy on 06/20/2023 with [facility].  Chart reviewed as part of pre-operative protocol coverage. According to the RCRI, patient has a 0.4% risk of MACE. Patient reports activity equivalent to >4.0 METS (walking dog 20-30 minutes daily, weight lifting every other day).   Given past medical history and time since last visit, based on ACC/AHA guidelines, Micki Query would be at acceptable risk for the planned procedure without further cardiovascular testing.   Patient was advised that if she develops new symptoms prior to surgery to contact our office to arrange a follow-up appointment.  she verbalized understanding.   I will route this recommendation to the requesting party via Epic fax function.  Please call with questions.  Time:   Today, I have spent 6 minutes with the patient with telehealth technology discussing medical history, symptoms, and management plan.     Carlos Levering, NP  05/12/2023, 1:48 PM

## 2023-05-14 ENCOUNTER — Ambulatory Visit: Payer: Managed Care, Other (non HMO) | Attending: Cardiology | Admitting: Student

## 2023-05-14 DIAGNOSIS — Z01810 Encounter for preprocedural cardiovascular examination: Secondary | ICD-10-CM | POA: Diagnosis not present

## 2023-05-14 NOTE — Progress Notes (Signed)
Referring Physician:  No referring provider defined for this encounter.  Primary Physician:  Dana Allan, MD  History of Present Illness: 05/21/2023 [name] has a history of cardiac arrest, hyperlipidemia, chronic back pain, and obesity.   She has automatic ICD.   History of lumbar surgery in 2012.   She also has noticed some weakness in the right hand for the last month. She denies dropping things, but it feels "off." No numbness or tingling. No pain in the hand. No current neck pain. No radiation of pain into the arm. She does have some pain in the left shoulder- has history of bilateral impingement.   She has intermittent LBP since surgery along with intermittent bilateral posterior leg pain to her knee, right > left. She has good days and bad days. She has some numbness in right great toe x years. No weakness in her legs.   Bowel/Bladder Dysfunction: none  She does not use tobacco.   Conservative measures:  Physical therapy: 1 visit of PT 03/25/21 in [place] for her back, none for her neck Multimodal medical therapy including regular antiinflammatories: mobic, prednisone, celebrex, tylenol, baclofen, neurontin Injections:  Right L5, S1 TF ESI on 04/11/21 in [place] Right L5 and S1 TF ESI 09/03/18  Past Surgery:  Lumbar surgery in 2012 in [place].  Christine Fitzpatrick has no symptoms of cervical myelopathy.  The symptoms are causing a significant impact on the patient's life.   Review of Systems:  A 10 point review of systems is negative, except for the pertinent positives and negatives detailed in the HPI.  Past Medical History: 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 03/29/2011    Past Surgical History: Past Surgical History:  Procedure Laterality Date   BREAST EXCISIONAL BIOPSY     CARDIAC DEFIBRILLATOR PLACEMENT      CATARACT EXTRACTION Left    MIDDLE EAR SURGERY Left    SVT ABLATION N/A 07/18/2022   Procedure: SVT ABLATION;  Surgeon: Lanier Prude, MD;  Location: [facility];  Service: Cardiovascular;  Laterality: N/A;    Allergies: Allergies as of 05/21/2023 - Review Complete 05/21/2023  Allergen Reaction Noted   Codeine Hives, Itching, and Rash 07/13/2004   Morphine and codeine Hives, Itching, and Rash 11/07/2009   Sulfa antibiotics Hives, Itching, and Rash 11/07/2009   Sulfamethoxazole-trimethoprim Rash 04/30/2004    Medications: Outpatient Encounter Medications as of 05/21/2023  Medication Sig   acetaminophen (TYLENOL) 500 MG tablet Take 500-1,000 mg by mouth every 6 (six) hours as needed (pain.).   azelastine (ASTELIN) 0.1 % nasal spray Place 2 sprays into both nostrils in the morning. Use in each nostril as directed   B Complex-C (B-COMPLEX WITH VITAMIN C) tablet Take 1 tablet by mouth daily in the afternoon.   cetirizine (ZYRTEC) 10 MG tablet Take 10 mg by mouth daily as needed for allergies.   estradiol (ESTRACE) 0.1 MG/GM vaginal cream Discard applicator Apply pea sized amount to tip of finger to urethra before bed. Wash hands well after application. Use Monday, Wednesday and Friday   Evolocumab (REPATHA) 140 MG/ML SOSY INJECT THE CONTENTS OF ONE SYRINGE INTO THE SKIN EVERY 14 DAYS.   fluconazole (DIFLUCAN) 150 MG tablet TAKE 1 TABLET BY MOUTH ONE TIME PER WEEK   methylPREDNISolone (MEDROL DOSEPAK) 4 MG TBPK tablet Take 4 mg by  mouth.   metoprolol succinate (TOPROL-XL) 25 MG 24 hr tablet TAKE 1 TABLET (25 MG TOTAL) BY MOUTH DAILY.   Vitamin D, Ergocalciferol, (DRISDOL) 1.25 MG (50000 UNIT) CAPS capsule Take 1 capsule (50,000 Units total) by mouth every 7 (seven) days.   [DISCONTINUED] Efinaconazole 10 % SOLN Apply 1 drop topically daily. (Patient not taking: Reported on 05/21/2023)   No facility-administered encounter medications on file as of 05/21/2023.    Social  History: Social History   Tobacco Use   Smoking status: Never   Smokeless tobacco: Never  Substance Use Topics   Alcohol use: Yes   Drug use: Never    Family Medical History: Family History  Problem Relation Age of Onset   Hypertension Mother    Hypertension Maternal Aunt    Diabetes Maternal Grandmother    Breast cancer Neg Hx     Physical Examination: Vitals:   05/21/23 1011  BP: 128/78    General: Patient is well developed, well nourished, calm, collected, and in no apparent distress. Attention to examination is appropriate.  Respiratory: Patient is breathing without any difficulty.   NEUROLOGICAL:     Awake, alert, oriented to person, place, and time.  Speech is clear and fluent. Fund of knowledge is appropriate.   Cranial Nerves: Pupils equal round and reactive to light.  Facial tone is symmetric.    No posterior cervical tenderness. No tenderness in bilateral trapezial region.   Reasonable ROM of both shoulders, minimal pain on left.   No posterior lumbar tenderness. Well healed lumbar incision.   No abnormal lesions on exposed skin.   Strength: Side Biceps Triceps Deltoid Interossei Grip Wrist Ext. Wrist Flex.  R 5 5 5 5 5 5 5   L 5 5 5 5 5 5 5    Side Iliopsoas Quads Hamstring PF DF EHL  R 5 5 5 5 5 5   L 5 5 5 5 5 5    Reflexes are 2+ and symmetric at the biceps, brachioradialis, patella and achilles.   Hoffman's is absent.  Clonus is not present.   Bilateral upper and lower extremity sensation is intact to light touch.  Some subjective numbness in right great toe.   Gait is normal.     Medical Decision Making  Imaging: No recent cervical or lumbar imaging.   I have personally reviewed the images and agree with the above interpretation.  Assessment and Plan: Christine Fitzpatrick is a pleasant 67 y.o. female has History of lumbar surgery in 2012.   She also has noticed some weakness in the right hand for the last month. She denies dropping things, but  it feels "off." No numbness or tingling. No pain in the hand. No current neck pain.   She has intermittent LBP since surgery along with intermittent bilateral posterior leg pain to her knee, right > left. She has good days and bad days. She has some numbness in right great toe x years. No weakness in her legs.   Her exam is reassuring with no weakness noted in right hand.   Treatment options discussed with patient and following plan made:   - Xrays of cervical and lumbar spine ordered.  - She has left knee scope scheduled later this week.  - Will message her back with xrays and regroup on further treatment plan.  - Consider PT for lumbar spine once she is over knee surgery. Can consider EMG/NCS in upper and lower extremities as well.   I spent a total of 25 minutes  in face-to-face and non-face-to-face activities related to this patient's care today including review of outside records, review of imaging, review of symptoms, physical exam, discussion of differential diagnosis, discussion of treatment options, and documentation.   Thank you for involving me in the care of this patient.   Drake Leach PA-C Dept. of Neurosurgery

## 2023-05-16 ENCOUNTER — Ambulatory Visit (INDEPENDENT_AMBULATORY_CARE_PROVIDER_SITE_OTHER): Payer: Managed Care, Other (non HMO)

## 2023-05-16 DIAGNOSIS — I471 Supraventricular tachycardia, unspecified: Secondary | ICD-10-CM | POA: Diagnosis not present

## 2023-05-16 LAB — CUP PACEART REMOTE DEVICE CHECK
Battery Remaining Longevity: 141 mo
Battery Voltage: 3.03 V
Brady Statistic RV Percent Paced: 0.05 %
Date Time Interrogation Session: 20240706184030
HighPow Impedance: 41 Ohm
HighPow Impedance: 49 Ohm
Implantable Lead Connection Status: 753985
Implantable Lead Implant Date: 20130501
Implantable Lead Location: 753860
Implantable Pulse Generator Implant Date: 20220322
Lead Channel Impedance Value: 494 Ohm
Lead Channel Impedance Value: 551 Ohm
Lead Channel Pacing Threshold Amplitude: 0.5 V
Lead Channel Pacing Threshold Pulse Width: 0.4 ms
Lead Channel Sensing Intrinsic Amplitude: 7.5 mV
Lead Channel Setting Pacing Amplitude: 1.5 V
Lead Channel Setting Pacing Pulse Width: 0.4 ms
Lead Channel Setting Sensing Sensitivity: 0.45 mV
Zone Setting Status: 755011

## 2023-05-21 ENCOUNTER — Encounter: Payer: Self-pay | Admitting: Orthopedic Surgery

## 2023-05-21 ENCOUNTER — Ambulatory Visit (INDEPENDENT_AMBULATORY_CARE_PROVIDER_SITE_OTHER): Payer: Managed Care, Other (non HMO) | Admitting: Orthopedic Surgery

## 2023-05-21 ENCOUNTER — Ambulatory Visit
Admission: RE | Admit: 2023-05-21 | Discharge: 2023-05-21 | Disposition: A | Discharge status: Home / Self Care | Payer: Managed Care, Other (non HMO) | Attending: Orthopedic Surgery | Admitting: Orthopedic Surgery

## 2023-05-21 ENCOUNTER — Ambulatory Visit
Admission: RE | Admit: 2023-05-21 | Discharge: 2023-05-21 | Disposition: A | Discharge status: Home / Self Care | Payer: Managed Care, Other (non HMO) | Source: Ambulatory Visit | Attending: Orthopedic Surgery | Admitting: Orthopedic Surgery

## 2023-05-21 VITALS — BP 128/78 | Ht 68.0 in | Wt 192.4 lb

## 2023-05-21 DIAGNOSIS — Z9889 Other specified postprocedural states: Secondary | ICD-10-CM

## 2023-05-21 DIAGNOSIS — M5442 Lumbago with sciatica, left side: Secondary | ICD-10-CM | POA: Diagnosis not present

## 2023-05-21 DIAGNOSIS — G8929 Other chronic pain: Secondary | ICD-10-CM | POA: Diagnosis not present

## 2023-05-21 DIAGNOSIS — M542 Cervicalgia: Secondary | ICD-10-CM

## 2023-05-21 DIAGNOSIS — M5441 Lumbago with sciatica, right side: Secondary | ICD-10-CM | POA: Diagnosis not present

## 2023-05-21 NOTE — Patient Instructions (Signed)
It was so nice to see you today. Thank you so much for coming in.    I ordered xrays of your neck and lower back. You can get these at [facility] (building with the white pillars) off of [address]. The address is [address], [place], [place] [address]. You do not need any appointment.   It takes 5-7 days for me to get the xray results after you have them done. Once I have them, I will message you. We can regroup and come up with a plan.   Good luck with your knee surgery!  Please do not hesitate to call if you have any questions or concerns. You can also message me in MyChart.   Drake Leach PA-C [phone]

## 2023-05-22 ENCOUNTER — Telehealth: Payer: Self-pay

## 2023-05-22 ENCOUNTER — Encounter: Payer: Managed Care, Other (non HMO) | Admitting: Podiatry

## 2023-05-22 NOTE — Telephone Encounter (Signed)
Fax received from [facility] on 05/14/23 in regard to cardiac clearance for Colonoscopy on 06/20/23 with Dr. Tobi Bastos at [facility].  Per [facility]:  Chart reviewed as part of pre-operative protocol coverage. According to the RCRI, patient has a 0.4% risk of MACE. Patient reports activity equivalent to >4.0 METS (walking dog 20-30 minutes daily, weight lifting every other day).    Given past medical history and time since last visit, based on ACC/AHA guidelines, Dorinne Graeff would be at acceptable risk for the planned procedure without further cardiovascular testing.    Patient was advised that if she develops new symptoms prior to surgery to contact our office to arrange a follow-up appointment.  she verbalized understanding.  Thanks, [place], [place]

## 2023-05-28 ENCOUNTER — Encounter: Payer: Self-pay | Admitting: Orthopedic Surgery

## 2023-05-28 ENCOUNTER — Telehealth: Payer: Managed Care, Other (non HMO)

## 2023-05-28 NOTE — Telephone Encounter (Signed)
Cervical xrays dated 05/26/23:  FINDINGS: There is no evidence of cervical spine fracture or prevertebral soft tissue swelling. Alignment is normal. There is mild disc space narrowing at C7-T1. Anterior endplate osteophytes are seen from C4 through T1.   IMPRESSION: Mild degenerative changes in the cervical spine.     Electronically Signed   By: Darliss Cheney M.D.   On: 05/26/2023 23:24  Lumbar xrays dated 05/26/23:  FINDINGS: Five lumbar type vertebra. There is no acute fracture or subluxation of the lumbar spine. There is multilevel facet arthropathy. The visualized posterior elements are intact. There is disc space narrowing at L5-S1. The soft tissues are unremarkable.   IMPRESSION: 1. No acute findings. 2. Degenerative changes and facet arthropathy.     Electronically Signed   By: Elgie Collard M.D.   On: 05/26/2023 23:34   I have personally reviewed the images and agree with the above interpretation. I also see a slip at C7-T1.

## 2023-05-29 ENCOUNTER — Encounter: Payer: Self-pay | Admitting: Cardiology

## 2023-06-06 NOTE — Progress Notes (Signed)
Remote ICD transmission.   

## 2023-06-13 ENCOUNTER — Encounter: Payer: Self-pay | Admitting: Gastroenterology

## 2023-06-14 NOTE — Progress Notes (Signed)
Cardiology Office Note Date:  06/14/2023  Patient ID:  Christine Fitzpatrick, Christine Fitzpatrick Oct 01, 1956, MRN [id number] PCP:  Dana Allan, MD  Cardiologist:  None Electrophysiologist: Lanier Prude, MD    Chief Complaint: increased SVT burden  History of Present Illness: Christine Fitzpatrick is a 67 y.o. female with PMH notable for SVT, cardiac arrest s/p ICD; seen today for Lanier Prude, MD for acute visit due to increased SVT burden. She is s/p EP study 07/2022 with uninducible arrhythmia. She last saw Dr. Lalla Brothers 09/2022 where she was doing well without complaints.  She messaged office in mid-July with concerns regarding her recent remote ICD transmission and increased SVT burden.  On follow-up today, she has intermittent palpitation episodes for couple seconds in duration. During some of the episodes, feels heart pounding hard in chest, but not every episode. Never has SOB, dizziness, syncope, or presyncope with episodes.  Checks BP regularly, most readings 110-120s systolic. She does have history of hypotension with BB.  She recently had knee surgery so has not been able to be as active as she was, but recovery is progressing. Taking PRN tylenol for pain. 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 Has screening colonoscopy planned for next week.   Device Information: MDT single chamber ICD, imp 03/2012; dx cardiac arrest Gen change 01/2021 [facility])  AAD History: Flecainide - in past  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 03/29/2011    Past Surgical History:  Procedure Laterality Date   BREAST EXCISIONAL BIOPSY     CARDIAC DEFIBRILLATOR PLACEMENT     CATARACT EXTRACTION Left    MIDDLE EAR SURGERY Left    SVT ABLATION N/A 07/18/2022   Procedure: SVT ABLATION;  Surgeon: Lanier Prude,  MD;  Location: [facility];  Service: Cardiovascular;  Laterality: N/A;    Current Outpatient Medications  Medication Instructions   acetaminophen (TYLENOL) 500-1,000 mg, Oral, Every 6 hours PRN   azelastine (ASTELIN) 0.1 % nasal spray 2 sprays, Each Nare, Every morning, Use in each nostril as directed   B Complex-C (B-COMPLEX WITH VITAMIN C) tablet 1 tablet, Oral, Daily   cetirizine (ZYRTEC) 10 mg, Oral, Daily PRN   estradiol (ESTRACE) 0.1 MG/GM vaginal cream Discard applicator Apply pea sized amount to tip of finger to urethra before bed. Wash hands well after application. Use Monday, Wednesday and Friday   Evolocumab (REPATHA) 140 MG/ML SOSY INJECT THE CONTENTS OF ONE SYRINGE INTO THE SKIN EVERY 14 DAYS.   fluconazole (DIFLUCAN) 150 MG tablet TAKE 1 TABLET BY MOUTH ONE TIME PER WEEK   methylPREDNISolone (MEDROL DOSEPAK) 4 mg, Oral   metoprolol succinate (TOPROL-XL) 25 mg, Oral, Daily   Vitamin D (Ergocalciferol) (DRISDOL) 50,000 Units, Oral, Every 7 days    Social History:  The patient  reports that she has never smoked. She has never used smokeless tobacco. She reports current alcohol use. She reports that she does not use drugs.   Family History:   The patient's family history includes Diabetes in her maternal grandmother; Hypertension in her maternal aunt and mother.  ROS:  Please see the history of present illness. All other systems are reviewed and otherwise negative.   PHYSICAL EXAM:  VS:  BP 130/70 (BP Location: Left Arm, Patient Position: Sitting)   Pulse  98   Ht 5\' 8"  (1.727 m)   Wt 195 lb (88.5 kg)   SpO2 96%   BMI 29.65 kg/m  BMI: There is no height or weight on file to calculate BMI.  GEN- The patient is well appearing, alert and oriented x 3 today.   Lungs- Clear to ausculation bilaterally, normal work of breathing.  Heart- Regular, tachycardic rate and rhythm, no murmurs, rubs or gallops Extremities- No peripheral edema, warm, dry Skin-  device pocket  well-healed, no tethering   Device interrogation done today and reviewed by myself:  Battery 11.7 years Lead thresholds, impedence, sensing stable  One NSVT episode, 1 second in duration Frequent, brief SVT episodes; longest duration 4 minutes, most recently all episodes 1-2 minutes No changes made today  EKG is ordered. Personal review of EKG from today shows:    EKG Interpretation Date/Time:  Friday June 15 2023 10:17:30 EDT Ventricular Rate:  98 PR Interval:  180 QRS Duration:  88 QT Interval:  360 QTC Calculation: 459 R Axis:   16  Text Interpretation: Normal sinus rhythm Normal ECG When compared with ECG of 18-Jul-2022 15:43, PR interval has decreased Confirmed by Sherie Don [id number]) on 06/15/2023 10:24:04 AM    Recent Labs: 07/05/2022: BUN 12; Creatinine, Ser 0.88; Hemoglobin 14.4; Platelets 299; Potassium 4.3; Sodium 139  No results found for requested labs within last 365 days.   CrCl cannot be calculated (Patient's most recent lab result is older than the maximum 21 days allowed.).   Wt Readings from Last 3 Encounters:  05/21/23 192 lb 6.4 oz (87.3 kg)  04/05/23 186 lb (84.4 kg)  04/03/23 190 lb (86.2 kg)     Additional studies reviewed include: Previous EP, cardiology notes.   30 day Ambulatory monitor, 08/29/2022 HR 62 - 157, average 85 bpm. No atrial fibrillation detected. Occasional ventricular ectopy, 2% Rare supraventricular ectopy, <1%.  Ambulatory monitor, 06/08/2023 HR 57 - 160bpm, average 83 bpm. 5 SVT, longest lasting 4 beats.  Rare supraventricular ectopy. 1.2% PVC burden. No sustained arrhythmias.   ASSESSMENT AND PLAN:  #) SVT #) palpitations Continues to have intermittent palpitation episodes, increasing in frequency Rhythms do not appear to be afib/flutter Increase toprol 25mg  BID. Continue to check BP/pulse regularly. If symptomatic hypotension develops, notify office. If asymptomatic, continue BID dosing. Start OTC magnesium  supplement Update electrolytes, thyroid labs 2 week monitor to better assess burden  #) cardiac arrest #) MDT single chamber ICD in situ Very brief NSVT by device No therapies Device functioning well, see paceart for details       Current medicines are reviewed at length with the patient today.   The patient has concerns regarding her medicines.  The following changes were made today:   INCREASE toprol to 25mg  BID  Labs/ tests ordered today include:  Orders Placed This Encounter  Procedures   Basic metabolic panel   T4, free   Magnesium   TSH   LONG TERM MONITOR (3-14 DAYS)   EKG 12-Lead     Disposition: Follow up with EP APP  6 weeks    Signed, Sherie Don, NP  06/14/23  5:01 PM  Electrophysiology [facility]

## 2023-06-15 ENCOUNTER — Encounter: Payer: Self-pay | Admitting: Cardiology

## 2023-06-15 ENCOUNTER — Ambulatory Visit: Payer: Managed Care, Other (non HMO) | Attending: Cardiology | Admitting: Cardiology

## 2023-06-15 ENCOUNTER — Ambulatory Visit: Payer: Managed Care, Other (non HMO)

## 2023-06-15 VITALS — BP 130/70 | HR 98 | Ht 68.0 in | Wt 195.0 lb

## 2023-06-15 DIAGNOSIS — Z95810 Presence of automatic (implantable) cardiac defibrillator: Secondary | ICD-10-CM

## 2023-06-15 DIAGNOSIS — Z8674 Personal history of sudden cardiac arrest: Secondary | ICD-10-CM

## 2023-06-15 DIAGNOSIS — I471 Supraventricular tachycardia, unspecified: Secondary | ICD-10-CM | POA: Diagnosis not present

## 2023-06-15 DIAGNOSIS — R002 Palpitations: Secondary | ICD-10-CM | POA: Diagnosis not present

## 2023-06-15 LAB — CUP PACEART INCLINIC DEVICE CHECK
Date Time Interrogation Session: 20240809114154
Implantable Lead Connection Status: 753985
Implantable Lead Implant Date: 20130501
Implantable Lead Location: 753860
Implantable Pulse Generator Implant Date: 20220322

## 2023-06-15 MED ORDER — METOPROLOL SUCCINATE ER 25 MG PO TB24: 25.0000 mg | ORAL_TABLET | Freq: Two times a day (BID) | ORAL | 2 refills | Status: DC

## 2023-06-15 NOTE — Patient Instructions (Addendum)
Medication Instructions:  Your physician has recommended you make the following change in your medication:   increase Toprol XL to daily twice daily,  Start taking over the counter magnesium supplements   *If you need a refill on your cardiac medications before your next appointment, please call your pharmacy*   Lab Work:  BMET, TSH, T4 FREE, MAGNESIUM Level  If you have labs (blood work) drawn today and your tests are completely normal, you will receive your results only by: MyChart Message (if you have MyChart) OR A paper copy in the mail If you have any lab test that is abnormal or we need to change your treatment, we will call you to review the results.   Testing/Procedures: Christena Deem- Long Term Monitor Instructions  Your physician has requested you wear a ZIO patch monitor for 14 days.  This is a single patch monitor. [employer] supplies one patch monitor per enrollment. Additional tickers are not available. Please do not apply patch if you will be having a Nuclear Stress Test, Echocardiogram, Cardiac CT, MRI, or Chest Xray during the period you would be wearing the monitor. The patch cannot be worn during these tests. You cannot remove and re-apply the ZIO XT patch monitor. Your ZIO patch monitor will be mailed 3 day USPS to your address on file. It may take 3-5 days to receive your monitor after you have been enrolled.  Once you have received your monitor, please review the enclosed instructions. Your monitor has already been registered assigning a specific monitor serial # to you.  Billing and Patient Assistance Program Information  We have supplied [employer] with any of your insurance information on file for billing purposes.[employer] offers a sliding scale Patient Assistance Program for patients that do not have insurance, or whose insurance does not completely cover the cost of the ZIO monitor. You must apply for the Patient Assistance Program to qualify for this discounted rate. To  apply, please call [employer] at [phone], select option 4, select option 2, ask to apply for Patient Assistance Program. Meredeth Ide will ask your household income, and how many people are in your household. They will quote your out-of-pocket cost based on that information. [employer] will also be able to set up a 51-month, interest-free payment plan if needed.  Applying the monitor   Shave hair from upper left chest.  Hold abrader disc by orange tab. Rub abrader in 40 strokes over the upper left chest as indicated in your monitor instructions.   Clean area with 4 enclosed alcohol pads. Let dry. Apply patch as indicated in monitor instructions. Patch will be placed under collarbone on left side of chest with arrow pointing upward. Rub patch adhesive wings for 2 minutes. Remove white label marked "1". Remove the white label marked "2". Rub patch adhesive wings for 2 additional minutes. While looking in a mirror, press and release button in center of patch. A small green light will flash 3-4 times. This will be your only indicator that the monitor has been turned on. Do not shower for the first 24 hours. You may shower after the first 24 hours.   Press the button if you feel a symptom. You will hear a small click. Record Date, Time and Symptom in the Patient Logbook.   When you are ready to remove the patch, follow instructions on the last 2 pages of Patient Logbook. Stick patch monitor onto the last page of Patient Logbook.  Place Patient Logbook in the blue and white box. Use  locking tab on box and tape box closed securely. The blue and white box has prepaid postage on it. Please place it in the mailbox as soon as possible. Your physician should have your test results approximately 7 days after the monitor has been mailed back to [facility].   Call [facility] Customer Care at [phone] if you have questions regarding your ZIO XT patch monitor. Call them immediately if you see an orange light  blinking on your monitor.   If your monitor falls off in less than 4 days, contact our Monitor department at [phone]. If your monitor becomes loose or falls off after 4 days call [employer] at [phone] for suggestions on securing your monitor    Follow-Up: At [facility], you and your health needs are our priority.  As part of our continuing mission to provide you with exceptional heart care, we have created designated Provider Care Teams.  These Care Teams include your primary Cardiologist (physician) and Advanced Practice Providers (APPs -  Physician Assistants and Nurse Practitioners) who all work together to provide you with the care you need, when you need it.  We recommend signing up for the patient portal called "MyChart".  Sign up information is provided on this After Visit Summary.  MyChart is used to connect with patients for Virtual Visits (Telemedicine).  Patients are able to view lab/test results, encounter notes, upcoming appointments, etc.  Non-urgent messages can be sent to your provider as well.   To learn more about what you can do with MyChart, go to [email or web address].    Your next appointment:   6 week(s)  Provider:   You will see one of the following Advanced Practice Providers on your designated Care Team:   Sherie Don, [place]

## 2023-06-18 ENCOUNTER — Encounter: Payer: Managed Care, Other (non HMO) | Admitting: Cardiology

## 2023-06-20 ENCOUNTER — Other Ambulatory Visit: Payer: Self-pay

## 2023-06-20 ENCOUNTER — Encounter: Payer: Self-pay | Admitting: Gastroenterology

## 2023-06-20 ENCOUNTER — Ambulatory Visit
Admission: RE | Admit: 2023-06-20 | Discharge: 2023-06-20 | Disposition: A | Discharge status: Home / Self Care | Payer: Managed Care, Other (non HMO) | Attending: Gastroenterology | Admitting: Gastroenterology

## 2023-06-20 ENCOUNTER — Encounter
Admission: RE | Disposition: A | Discharge status: Home / Self Care | Payer: Self-pay | Source: Home / Self Care | Attending: Gastroenterology

## 2023-06-20 ENCOUNTER — Ambulatory Visit: Payer: Managed Care, Other (non HMO) | Admitting: Certified Registered"

## 2023-06-20 DIAGNOSIS — Z8674 Personal history of sudden cardiac arrest: Secondary | ICD-10-CM | POA: Diagnosis not present

## 2023-06-20 DIAGNOSIS — Z79899 Other long term (current) drug therapy: Secondary | ICD-10-CM | POA: Diagnosis not present

## 2023-06-20 DIAGNOSIS — D122 Benign neoplasm of ascending colon: Secondary | ICD-10-CM | POA: Insufficient documentation

## 2023-06-20 DIAGNOSIS — Z860100 Personal history of colon polyps, unspecified: Secondary | ICD-10-CM

## 2023-06-20 DIAGNOSIS — I10 Essential (primary) hypertension: Secondary | ICD-10-CM | POA: Diagnosis not present

## 2023-06-20 DIAGNOSIS — Z1211 Encounter for screening for malignant neoplasm of colon: Secondary | ICD-10-CM | POA: Diagnosis not present

## 2023-06-20 DIAGNOSIS — D126 Benign neoplasm of colon, unspecified: Secondary | ICD-10-CM

## 2023-06-20 DIAGNOSIS — D120 Benign neoplasm of cecum: Secondary | ICD-10-CM | POA: Diagnosis not present

## 2023-06-20 DIAGNOSIS — Z09 Encounter for follow-up examination after completed treatment for conditions other than malignant neoplasm: Secondary | ICD-10-CM | POA: Insufficient documentation

## 2023-06-20 DIAGNOSIS — K5730 Diverticulosis of large intestine without perforation or abscess without bleeding: Secondary | ICD-10-CM | POA: Diagnosis not present

## 2023-06-20 HISTORY — PX: COLONOSCOPY WITH PROPOFOL: SHX5780

## 2023-06-20 HISTORY — DX: Benign neoplasm of colon, unspecified: D12.6

## 2023-06-20 HISTORY — PX: POLYPECTOMY: SHX5525

## 2023-06-20 SURGERY — COLONOSCOPY WITH PROPOFOL
Anesthesia: General

## 2023-06-20 MED ORDER — SODIUM CHLORIDE 0.9 % IV SOLN: INTRAVENOUS | Status: DC

## 2023-06-20 MED ORDER — PROPOFOL 500 MG/50ML IV EMUL
INTRAVENOUS | Status: DC | PRN
  Administered 2023-06-20: 165 ug/kg/min via INTRAVENOUS

## 2023-06-20 MED ORDER — PROPOFOL 10 MG/ML IV BOLUS
INTRAVENOUS | Status: DC | PRN
Start: 2023-06-20 — End: 2023-06-20
  Administered 2023-06-20: 60 mg via INTRAVENOUS

## 2023-06-20 MED ORDER — LIDOCAINE HCL (CARDIAC) PF 100 MG/5ML IV SOSY
PREFILLED_SYRINGE | INTRAVENOUS | Status: DC | PRN
  Administered 2023-06-20: 100 mg via INTRAVENOUS

## 2023-06-20 NOTE — H&P (Signed)
Wyline Mood, MD [address], [address], [place], [place], [address] [address], [place], [place], [address] Phone: [phone]  Fax: [phone]  Primary Care Physician:  Dana Allan, MD   Pre-Procedure History & Physical: HPI:  Christine Fitzpatrick is a 67 y.o. female is here for an colonoscopy.   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 Authorizing Provider  acetaminophen (TYLENOL) 500 MG tablet Take 500-1,000 mg by mouth every 6 (six) hours as needed (pain.).    [provider]  azelastine (ASTELIN) 0.1 % nasal spray Place 2 sprays into both nostrils in the morning. Use in each nostril as directed    [provider]  B Complex-C (B-COMPLEX WITH VITAMIN C) tablet Take 1 tablet by mouth daily in the afternoon.    [provider]  cetirizine (ZYRTEC) 10 MG tablet Take 10 mg by mouth daily as needed for allergies.    [provider]  estradiol (ESTRACE) 0.1 MG/GM vaginal cream Discard applicator Apply pea sized amount to tip of finger to urethra before bed. Wash hands well after application. Use Monday, Wednesday and Friday 02/07/23   Vanna Scotland, MD  Evolocumab (REPATHA) 140 MG/ML SOSY INJECT THE CONTENTS OF ONE SYRINGE INTO THE SKIN EVERY 14 DAYS. 09/11/22   Corky Downs, MD  fluconazole (DIFLUCAN) 150 MG tablet TAKE 1 TABLET BY MOUTH ONE TIME PER WEEK  12/11/22   Edwin Cap, DPM  MAGNESIUM PO Take by mouth daily. Over the counter take by mouth daily    [provider]  methylPREDNISolone (MEDROL DOSEPAK) 4 MG TBPK tablet Take 4 mg by mouth. 04/04/23   [provider]  metoprolol succinate (TOPROL-XL) 25 MG 24 hr tablet Take 1 tablet (25 mg total) by mouth in the morning and at bedtime. Take a 25 mg tablet twice daily 06/18/23   Sherie Don, NP  Vitamin D, Ergocalciferol, (DRISDOL) 1.25 MG (50000 UNIT) CAPS capsule Take 1 capsule (50,000 Units total) by mouth every 7 (seven) days. 04/08/23   Dana Allan, MD    Allergies as of 05/02/2023 - Review Complete 04/08/2023  Allergen Reaction Noted   Codeine Hives, Itching, and Rash 07/13/2004   Morphine and codeine Hives, Itching, and Rash 11/07/2009   Sulfa antibiotics Hives, Itching, and Rash 11/07/2009   Sulfamethoxazole-trimethoprim Rash 04/30/2004    Family History  Problem Relation Age of Onset   Hypertension Mother    Hypertension Maternal Aunt    Diabetes Maternal Grandmother    Breast cancer Neg Hx     Social History   Socioeconomic History   Marital status: Married    Spouse name: Not on file   Number of children: Not on file   Years of education: Not on file   Highest education level: Master's degree (e.g., MA, MS, MEng, MEd, MSW, MBA)  Occupational History  Not on file  Tobacco Use   Smoking status: Never   Smokeless tobacco: Never  Substance and Sexual Activity   Alcohol use: Yes   Drug use: Never   Sexual activity: Not on file  Other Topics Concern   Not on file  Social History Narrative   Not on file   Social Determinants of Health   Financial Resource Strain: Low Risk  (04/03/2023)   Overall Financial Resource Strain (CARDIA)    Difficulty of Paying Living Expenses: Not hard at all  Food Insecurity: No Food Insecurity (04/03/2023)   Hunger Vital Sign    Worried About Running Out of Food in the Last Year: Never true    Ran Out of Food  in the Last Year: Never true  Transportation Needs: No Transportation Needs (04/03/2023)   PRAPARE - Administrator, Civil Service (Medical): No    Lack of Transportation (Non-Medical): No  Physical Activity: Insufficiently Active (04/03/2023)   Exercise Vital Sign    Days of Exercise per Week: 5 days    Minutes of Exercise per Session: 20 min  Stress: No Stress Concern Present (04/03/2023)   [employer] of Occupational Health - Occupational Stress Questionnaire    Feeling of Stress : Not at all  Social Connections: Socially Integrated (04/03/2023)   Social Connection and Isolation Panel [NHANES]    Frequency of Communication with Friends and Family: Three times a week    Frequency of Social Gatherings with Friends and Family: Three times a week    Attends Religious Services: 1 to 4 times per year    Active Member of Clubs or Organizations: Yes    Attends Banker Meetings: 1 to 4 times per year    Marital Status: Married  Catering manager Violence: Not on file    Review of Systems: See HPI, otherwise negative ROS  Physical Exam: There were no vitals taken for this visit. General:   Alert,  pleasant and cooperative in NAD Head:  Normocephalic and atraumatic. Neck:  Supple; no masses or thyromegaly. Lungs:  Clear throughout to auscultation, normal respiratory effort.    Heart:  +S1, +S2, Regular rate and rhythm, No edema. Abdomen:  Soft, nontender and nondistended. Normal bowel sounds, without guarding, and without rebound.   Neurologic:  Alert and  oriented x4;  grossly normal neurologically.  Impression/Plan: Christine Fitzpatrick is here for an colonoscopy to be performed for Screening colonoscopy average risk   Risks, benefits, limitations, and alternatives regarding  colonoscopy have been reviewed with the patient.  Questions have been answered.  All parties agreeable.   Wyline Mood, MD  06/20/2023, 8:25 AM

## 2023-06-20 NOTE — Transfer of Care (Signed)
Immediate Anesthesia Transfer of Care Note  Patient: Christine Fitzpatrick  Procedure(s) Performed: COLONOSCOPY WITH PROPOFOL POLYPECTOMY  Patient Location: Endoscopy Unit  Anesthesia Type:General  Level of Consciousness: drowsy and patient cooperative  Airway & Oxygen Therapy: Patient Spontanous Breathing and Patient connected to face mask oxygen  Post-op Assessment: Report given to RN and Post -op Vital signs reviewed and stable  Post vital signs: Reviewed and stable  Last Vitals:  Vitals Value Taken Time  BP 120/86 06/20/23 0914  Temp 35.7 C 06/20/23 0914  Pulse 86 06/20/23 0914  Resp 20 06/20/23 0914  SpO2 100 % 06/20/23 0914    Last Pain:  Vitals:   06/20/23 0914  TempSrc: Temporal  PainSc: Asleep         Complications: No notable events documented.

## 2023-06-20 NOTE — Op Note (Signed)
[facility] Gastroenterology Patient Name: Christine Fitzpatrick Procedure Date: 06/20/2023 8:43 AM MRN: [id number] Account #: [id number] Date of Birth: June 23, 1956 Admit Type: Outpatient Age: 67 Room: [facility] ENDO ROOM 3 Gender: Female Note Status: Finalized Instrument Name: Prentice Docker [id number] Procedure:             Colonoscopy Indications:           Surveillance: Personal history of adenomatous polyps                         on last colonoscopy > 5 years ago Providers:             Wyline Mood MD, MD Referring MD:          Dana Allan (Referring MD) Medicines:             Monitored Anesthesia Care Complications:         No immediate complications. Procedure:             Pre-Anesthesia Assessment:                        - Prior to the procedure, a History and Physical was                         performed, and patient medications, allergies and                         sensitivities were reviewed. The patient's tolerance                         of previous anesthesia was reviewed.                        - The risks and benefits of the procedure and the                         sedation options and risks were discussed with the                         patient. All questions were answered and informed                         consent was obtained.                        - ASA Grade Assessment: II - A patient with mild                         systemic disease.                        After obtaining informed consent, the colonoscope was                         passed under direct vision. Throughout the procedure,                         the patient's blood pressure, pulse, and oxygen                         saturations  were monitored continuously. The                         Colonoscope was introduced through the anus and                         advanced to the the cecum, identified by the                         appendiceal orifice. The colonoscopy was performed                          without difficulty. The patient tolerated the                         procedure well. The quality of the bowel preparation                         was excellent. The ileocecal valve, appendiceal                         orifice, and rectum were photographed. Findings:      The perianal and digital rectal examinations were normal.      Three sessile polyps were found in the ascending colon. The polyps were       5 to 7 mm in size. These polyps were removed with a cold snare.       Resection and retrieval were complete.      A 4 mm polyp was found in the cecum. The polyp was sessile. The polyp       was removed with a cold snare. Resection and retrieval were complete.      Multiple small-mouthed diverticula were found in the sigmoid colon.      The exam was otherwise without abnormality on direct and retroflexion       views. Impression:            - Three 5 to 7 mm polyps in the ascending colon,                         removed with a cold snare. Resected and retrieved.                        - One 4 mm polyp in the cecum, removed with a cold                         snare. Resected and retrieved.                        - Diverticulosis in the sigmoid colon.                        - The examination was otherwise normal on direct and                         retroflexion views. Recommendation:        - Discharge patient to home (with escort).                        - Resume previous  diet.                        - Continue present medications.                        - Await pathology results.                        - Repeat colonoscopy in 3 years for surveillance based                         on pathology results. Procedure Code(s):     --- Professional ---                        [phone], Colonoscopy, flexible; with removal of                         tumor(s), polyp(s), or other lesion(s) by snare                         technique Diagnosis Code(s):     --- Professional ---                         Z86.010, Personal history of colonic polyps                        D12.2, Benign neoplasm of ascending colon                        D12.0, Benign neoplasm of cecum                        K57.30, Diverticulosis of large intestine without                         perforation or abscess without bleeding CPT copyright 2022 American Medical Association. All rights reserved. The codes documented in this report are preliminary and upon coder review may  be revised to meet current compliance requirements. Wyline Mood, MD Wyline Mood MD, MD 06/20/2023 9:11:39 AM This report has been signed electronically. Number of Addenda: 0 Note Initiated On: 06/20/2023 8:43 AM Scope Withdrawal Time: 0 hours 7 minutes 48 seconds  Total Procedure Duration: 0 hours 12 minutes 18 seconds  Estimated Blood Loss:  Estimated blood loss: none.      [facility]

## 2023-06-20 NOTE — Anesthesia Postprocedure Evaluation (Signed)
Anesthesia Post Note  Patient: Christine Fitzpatrick  Procedure(s) Performed: COLONOSCOPY WITH PROPOFOL POLYPECTOMY  Patient location during evaluation: PACU Anesthesia Type: General Level of consciousness: awake and alert Pain management: pain level controlled Vital Signs Assessment: post-procedure vital signs reviewed and stable Respiratory status: spontaneous breathing, nonlabored ventilation, respiratory function stable and patient connected to nasal cannula oxygen Cardiovascular status: blood pressure returned to baseline and stable Postop Assessment: no apparent nausea or vomiting Anesthetic complications: no   No notable events documented.   Last Vitals:  Vitals:   06/20/23 0925 06/20/23 0939  BP: (!) 138/91 134/88  Pulse: 76 71  Resp: 17 14  Temp:    SpO2: 100% 100%    Last Pain:  Vitals:   06/20/23 0925  TempSrc:   PainSc: 5                  Yevette Edwards

## 2023-06-20 NOTE — Progress Notes (Signed)
Called pharmacy regarding propofol infiltration of patient right hand; they explained the same measures we took for the patient, elevate and apply cold compress

## 2023-06-20 NOTE — Anesthesia Preprocedure Evaluation (Signed)
Anesthesia Evaluation  Patient identified by MRN, date of birth, ID band Patient awake    Reviewed: Allergy & Precautions, H&P , NPO status , Patient's Chart, lab work & pertinent test results, reviewed documented beta blocker date and time   Airway Mallampati: II   Neck ROM: full    Dental  (+) Poor Dentition   Pulmonary pneumonia, resolved   Pulmonary exam normal        Cardiovascular Exercise Tolerance: Good hypertension, On Medications negative cardio ROS Normal cardiovascular exam Rhythm:regular Rate:Normal     Neuro/Psych  Neuromuscular disease  negative psych ROS   GI/Hepatic negative GI ROS, Neg liver ROS,,,  Endo/Other  negative endocrine ROS    Renal/GU Renal disease  negative genitourinary   Musculoskeletal   Abdominal   Peds  Hematology negative hematology ROS (+)   Anesthesia Other Findings Past Medical History: 03/05/2012: Aspiration pneumonia ([name]) 02/29/2012: Cardiac arrest ([name]) No date: Hyperlipidemia No date: Hypertension 04-11-11: Sudden cardiac death due to cardiac arrhythmia Past Surgical History: No date: BREAST EXCISIONAL BIOPSY No date: CARDIAC DEFIBRILLATOR PLACEMENT No date: CATARACT EXTRACTION; Left No date: MIDDLE EAR SURGERY; Left 07/18/2022: SVT ABLATION; N/A     Comment:  Procedure: SVT ABLATION;  Surgeon: Lanier Prude,               MD;  Location: [facility]ASIVE [facility];  Service:               Cardiovascular;  Laterality: N/A; BMI    Body Mass Index: 28.89 kg/m     Reproductive/Obstetrics negative OB ROS                             Anesthesia Physical Anesthesia Plan  ASA: 3  Anesthesia Plan: General   Post-op Pain Management:    Induction:   PONV Risk Score and Plan:   Airway Management Planned:   Additional Equipment:   Intra-op Plan:   Post-operative Plan:   Informed Consent: I have reviewed the patients History and  Physical, chart, labs and discussed the procedure including the risks, benefits and alternatives for the proposed anesthesia with the patient or authorized representative who has indicated his/her understanding and acceptance.     Dental Advisory Given  Plan Discussed with: CRNA  Anesthesia Plan Comments:        Anesthesia Quick Evaluation

## 2023-06-20 NOTE — Anesthesia Procedure Notes (Signed)
Procedure Name: General with mask airway Date/Time: 06/20/2023 9:05 AM  Performed by: Mohammed Kindle, CRNAPre-anesthesia Checklist: Patient identified, Emergency Drugs available, Suction available and Patient being monitored Patient Re-evaluated:Patient Re-evaluated prior to induction Oxygen Delivery Method: Simple face mask Induction Type: IV induction Placement Confirmation: positive ETCO2, CO2 detector and breath sounds checked- equal and bilateral Dental Injury: Teeth and Oropharynx as per pre-operative assessment

## 2023-06-21 ENCOUNTER — Encounter: Payer: Self-pay | Admitting: Gastroenterology

## 2023-06-22 DIAGNOSIS — R002 Palpitations: Secondary | ICD-10-CM

## 2023-06-22 DIAGNOSIS — Z95810 Presence of automatic (implantable) cardiac defibrillator: Secondary | ICD-10-CM

## 2023-06-26 ENCOUNTER — Encounter: Payer: Self-pay | Admitting: Podiatry

## 2023-07-13 NOTE — Progress Notes (Unsigned)
Referring Physician:  Dana Allan, MD [address] [place],  [place] [address]  Primary Physician:  Dana Allan, MD  History of Present Illness: 07/13/2023 Ms. Freyah Hanus has a history of cardiac arrest, hyperlipidemia, chronic back pain, and obesity.   She has automatic ICD.   History of lumbar surgery in 2012.   Last seen by me on 05/21/23 for intermittent LBP since surgery along with intermittent bilateral posterior leg pain to her knee, right > left. She has good days and bad days. She has some numbness in right great toe x years. No weakness in her legs.   She has known lumbar spondylosis with DDD L5-S1.  She also had some had some questionable weakness in right hand. No weakness was noted on exam.   She has known cervical spondylosis with anterior spurring C4-T1, slight slip C7-T1.   She was getting ready to have knee surgery and further treatment was put off until she recovered.   She is here for follow up.   No symptoms in her hands. She feels strong. No issues.   She has intermittent right > left sided LBP with no leg pain. Minimal numbness in great toe, but PT has helped. No weakness. LBP is worse with standing in one place. She does okay with walking and sitting. Overall, she is doing much better than the last time I saw her!  She is about 2 months out from her knee surgery.   Bowel/Bladder Dysfunction: none  She does not use tobacco.   Conservative measures:  Physical therapy: in PT for her knee now at [facility].  Multimodal medical therapy including regular antiinflammatories: mobic, prednisone, celebrex, tylenol, baclofen, neurontin Injections:  Right L5, S1 TF ESI on 04/11/21 in [place] Right L5 and S1 TF ESI 09/03/18  Past Surgery:  Lumbar surgery in 2012 in [place].  Melynda Beyea has no symptoms of cervical myelopathy.  The symptoms are causing a significant impact on the patient's life.   Review of Systems:  A 10 point review of systems is negative,  except for the pertinent positives and negatives detailed in the HPI.  Past Medical History: 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 03/29/2011    Past Surgical History: Past Surgical History:  Procedure Laterality Date   BREAST EXCISIONAL BIOPSY     CARDIAC DEFIBRILLATOR PLACEMENT     CATARACT EXTRACTION Left    COLONOSCOPY WITH PROPOFOL N/A 06/20/2023   Procedure: COLONOSCOPY WITH PROPOFOL;  Surgeon: Wyline Mood, MD;  Location: [facility] ENDOSCOPY;  Service: Gastroenterology;  Laterality: N/A;   MIDDLE EAR SURGERY Left    POLYPECTOMY  06/20/2023   Procedure: POLYPECTOMY;  Surgeon: Wyline Mood, MD;  Location[facility] ENDOSCOPY;  Service: Gastroenterology;;   SVT ABLATION N/A 07/18/2022   Procedure: SVT ABLATION;  Surgeon: Lanier Prude, MD;  Location: [facility] INVASIVE CV LAB;  Service: Cardiovascular;  Laterality: N/A;    Allergies: Allergies as of 07/19/2023 - Review Complete 06/20/2023  Allergen Reaction Noted   Codeine Hives, Itching, and Rash 07/13/2004   Morphine and codeine Hives, Itching, and Rash 11/07/2009   Sulfa antibiotics Hives, Itching, and Rash 11/07/2009   Sulfamethoxazole-trimethoprim Rash 04/30/2004    Medications: Outpatient Encounter Medications as of 07/19/2023  Medication Sig   acetaminophen (TYLENOL) 500 MG tablet Take 500-1,000 mg by mouth every 6 (six) hours as needed (  pain.).   azelastine (ASTELIN) 0.1 % nasal spray Place 2 sprays into both nostrils in the morning. Use in each nostril as directed   B Complex-C (B-COMPLEX WITH VITAMIN C) tablet Take 1 tablet by mouth daily in the afternoon.   cetirizine (ZYRTEC) 10 MG tablet Take 10 mg by mouth daily as needed for allergies.   estradiol (ESTRACE) 0.1 MG/GM vaginal cream Discard applicator Apply pea sized amount to tip of finger to urethra before bed. Wash  hands well after application. Use Monday, Wednesday and Friday   Evolocumab (REPATHA) 140 MG/ML SOSY INJECT THE CONTENTS OF ONE SYRINGE INTO THE SKIN EVERY 14 DAYS.   fluconazole (DIFLUCAN) 150 MG tablet TAKE 1 TABLET BY MOUTH ONE TIME PER WEEK   MAGNESIUM PO Take by mouth daily. Over the counter take by mouth daily (Patient not taking: Reported on 06/20/2023)   methylPREDNISolone (MEDROL DOSEPAK) 4 MG TBPK tablet Take 4 mg by mouth. (Patient not taking: Reported on 06/20/2023)   metoprolol succinate (TOPROL-XL) 25 MG 24 hr tablet Take 1 tablet (25 mg total) by mouth in the morning and at bedtime. Take a 25 mg tablet twice daily   Vitamin D, Ergocalciferol, (DRISDOL) 1.25 MG (50000 UNIT) CAPS capsule Take 1 capsule (50,000 Units total) by mouth every 7 (seven) days.   No facility-administered encounter medications on file as of 07/19/2023.    Social History: Social History   Tobacco Use   Smoking status: Never   Smokeless tobacco: Never  Vaping Use   Vaping status: Never Used  Substance Use Topics   Alcohol use: Yes   Drug use: Never    Family Medical History: Family History  Problem Relation Age of Onset   Hypertension Mother    Hypertension Maternal Aunt    Diabetes Maternal Grandmother    Breast cancer Neg Hx     Physical Examination: There were no vitals filed for this visit.    Awake, alert, oriented to person, place, and time.  Speech is clear and fluent. Fund of knowledge is appropriate.   Cranial Nerves: Pupils equal round and reactive to light.  Facial tone is symmetric.    Mild right sided lower lumbar tenderness. Well healed lumbar incision.   No abnormal lesions on exposed skin.   Strength: Side Biceps Triceps Deltoid Interossei Grip Wrist Ext. Wrist Flex.  R 5 5 5 5 5 5 5   L 5 5 5 5 5 5 5    Side Iliopsoas Quads Hamstring PF DF EHL  R 5 5 5 5 5 5   L 5 5 5 5 5 5    Reflexes are 2+ and symmetric at the biceps, brachioradialis, patella and achilles.    Hoffman's is absent.  Clonus is not present.   Bilateral upper and lower extremity sensation is intact to light touch.    Gait is normal.     Medical Decision Making  Imaging: none  Assessment and Plan: Ms. Stbernard is a pleasant 66 y.o. female has History of lumbar surgery in 2012.   She has intermittent right > left sided LBP with no leg pain. Minimal numbness in great toe, but PT has helped. No weakness. LBP is worse with standing in one place.   She has known lumbar spondylosis with DDD L5-S1.  No neck or arm pain. No symptoms in her hands. No weakness.   She has known cervical spondylosis with anterior spurring C4-T1, slight slip C7-T1.   Treatment options discussed with patient and following plan made:   -  PT orders for cervical and lumbar spine. Will send to [facility].  - If LBP does not improve or gets worse, may consider CT scan (she has automatic ICD and cannot have MRI).  - Follow up in 2 months for recheck.   I spent a total of 15 minutes in face-to-face and non-face-to-face activities related to this patient's care today including review of outside records, review of imaging, review of symptoms, physical exam, discussion of differential diagnosis, discussion of treatment options, and documentation.   Drake Leach PA-C Dept. of Neurosurgery

## 2023-07-19 ENCOUNTER — Ambulatory Visit (INDEPENDENT_AMBULATORY_CARE_PROVIDER_SITE_OTHER): Payer: Medicare Other | Admitting: Orthopedic Surgery

## 2023-07-19 ENCOUNTER — Encounter: Payer: Self-pay | Admitting: Orthopedic Surgery

## 2023-07-19 VITALS — BP 115/68 | Ht 68.0 in | Wt 190.0 lb

## 2023-07-19 DIAGNOSIS — M5136 Other intervertebral disc degeneration, lumbar region: Secondary | ICD-10-CM

## 2023-07-19 DIAGNOSIS — M47812 Spondylosis without myelopathy or radiculopathy, cervical region: Secondary | ICD-10-CM

## 2023-07-19 DIAGNOSIS — M47816 Spondylosis without myelopathy or radiculopathy, lumbar region: Secondary | ICD-10-CM | POA: Diagnosis not present

## 2023-07-25 ENCOUNTER — Ambulatory Visit (INDEPENDENT_AMBULATORY_CARE_PROVIDER_SITE_OTHER): Payer: Medicare Other | Admitting: Podiatry

## 2023-07-25 VITALS — BP 139/82 | HR 85

## 2023-07-25 DIAGNOSIS — B351 Tinea unguium: Secondary | ICD-10-CM

## 2023-07-25 DIAGNOSIS — M722 Plantar fascial fibromatosis: Secondary | ICD-10-CM | POA: Diagnosis not present

## 2023-07-25 MED ORDER — TERBINAFINE HCL 1 % EX CREA: 1.0000 | TOPICAL_CREAM | Freq: Two times a day (BID) | CUTANEOUS | 3 refills | Status: DC

## 2023-07-25 NOTE — Progress Notes (Signed)
Subjective:  Patient ID: Christine Fitzpatrick, female    DOB: [birth year],  MRN: [id number]  Chief Complaint  Patient presents with   Nail Problem    "It's better than it was.  It feels like it's stronger.  It stinks when I clean out my toenails.  It smells bad."   Foot Pain    "My feet have been hurting for the past 4-5 weeks on the sides.  I had Plantar Fasciitis about 30 years ago."    67 y.o. female presents with the above complaint. History confirmed with patient.   Objective:  Physical Exam: warm, good capillary refill, no trophic changes or ulcerative lesions, normal DP and PT pulses, and normal sensory exam. Left Foot:  Mycotic hallux nail Right Foot: point tenderness over the heel pad     Assessment:   1. Onychomycosis   2. Plantar fasciitis of right foot      Plan:  Patient was evaluated and treated and all questions answered.  Discussed the etiology and treatment options for plantar fasciitis including stretching, formal physical therapy, supportive shoegears such as a running shoe or sneaker, pre fabricated orthoses, injection therapy, and oral medications. We also discussed the role of surgical treatment of this for patients who do not improve after exhausting non-surgical treatment options.   -Educated patient on stretching and icing of the affected limb -Injection delivered to the plantar fascia of the right foot.  After sterile prep with povidone-iodine solution and alcohol, the right heel was injected with 0.5cc 2% xylocaine plain, 0.5cc 0.5% marcaine plain, 5mg  triamcinolone acetonide, and 2mg  dexamethasone was injected along the medial plantar fascia at the insertion on the plantar calcaneus. The patient tolerated the procedure well without complication.   Discussed further treatment options of her onychomycosis, has had several months of fluconazole Jublia and terbinafine.  I recommended avulsion of the nail plate.  Following sterile prep with Betadine and a digital  block with lidocaine and Marcaine, the left hallux nail was avulsed with a Therapist, nutritional.  It was irrigated and a dressing was applied.  Post care instructions given.  Following the 2 weeks of Neosporin and a Band-Aid she should apply terbinafine cream Rx sent to pharmacy twice to the nailbed  Return in about 6 months (around 01/22/2024) for follow up after nail fungus treatment.

## 2023-07-25 NOTE — Patient Instructions (Addendum)
Soak Instructions    THE DAY AFTER THE PROCEDURE  Place 1/4 cup of epsom salts (or betadine, or white vinegar) in a quart of warm tap water.  Submerge your foot or feet with outer bandage intact for the initial soak; this will allow the bandage to become moist and wet for easy lift off.  Once you remove your bandage, continue to soak in the solution for 20 minutes.  This soak should be done twice a day.  Next, remove your foot or feet from solution, blot dry the affected area and cover.  You may use a band aid large enough to cover the area or use gauze and tape.  Apply other medications to the area as directed by the doctor such as polysporin neosporin.  IF YOUR SKIN BECOMES IRRITATED WHILE USING THESE INSTRUCTIONS, IT IS OKAY TO SWITCH TO  WHITE VINEGAR AND WATER. Or you may use antibacterial soap and water to keep the toe clean  Monitor for any signs/symptoms of infection. Call the office immediately if any occur or go directly to the emergency room. Call with any questions/concerns.    Plantar Fasciitis (Heel Spur Syndrome) with Rehab The plantar fascia is a fibrous, ligament-like, soft-tissue structure that spans the bottom of the foot. Plantar fasciitis is a condition that causes pain in the foot due to inflammation of the tissue. SYMPTOMS  Pain and tenderness on the underneath side of the foot. Pain that worsens with standing or walking. CAUSES  Plantar fasciitis is caused by irritation and injury to the plantar fascia on the underneath side of the foot. Common mechanisms of injury include: Direct trauma to bottom of the foot. Damage to a small nerve that runs under the foot where the main fascia attaches to the heel bone. Stress placed on the plantar fascia due to bone spurs. RISK INCREASES WITH:  Activities that place stress on the plantar fascia (running, jumping, pivoting, or cutting). Poor strength and flexibility. Improperly fitted shoes. Tight calf muscles. Flat  feet. Failure to warm-up properly before activity. Obesity. PREVENTION Warm up and stretch properly before activity. Allow for adequate recovery between workouts. Maintain physical fitness: Strength, flexibility, and endurance. Cardiovascular fitness. Maintain a health body weight. Avoid stress on the plantar fascia. Wear properly fitted shoes, including arch supports for individuals who have flat feet.  PROGNOSIS  If treated properly, then the symptoms of plantar fasciitis usually resolve without surgery. However, occasionally surgery is necessary.  RELATED COMPLICATIONS  Recurrent symptoms that may result in a chronic condition. Problems of the lower back that are caused by compensating for the injury, such as limping. Pain or weakness of the foot during push-off following surgery. Chronic inflammation, scarring, and partial or complete fascia tear, occurring more often from repeated injections.  TREATMENT  Treatment initially involves the use of ice and medication to help reduce pain and inflammation. The use of strengthening and stretching exercises may help reduce pain with activity, especially stretches of the Achilles tendon. These exercises may be performed at home or with a therapist. Your caregiver may recommend that you use heel cups of arch supports to help reduce stress on the plantar fascia. Occasionally, corticosteroid injections are given to reduce inflammation. If symptoms persist for greater than 6 months despite non-surgical (conservative), then surgery may be recommended.   MEDICATION  If pain medication is necessary, then nonsteroidal anti-inflammatory medications, such as aspirin and ibuprofen, or other minor pain relievers, such as acetaminophen, are often recommended. Do not take pain medication within  7 days before surgery. Prescription pain relievers may be given if deemed necessary by your caregiver. Use only as directed and only as much as you  need. Corticosteroid injections may be given by your caregiver. These injections should be reserved for the most serious cases, because they may only be given a certain number of times.  HEAT AND COLD Cold treatment (icing) relieves pain and reduces inflammation. Cold treatment should be applied for 10 to 15 minutes every 2 to 3 hours for inflammation and pain and immediately after any activity that aggravates your symptoms. Use ice packs or massage the area with a piece of ice (ice massage). Heat treatment may be used prior to performing the stretching and strengthening activities prescribed by your caregiver, physical therapist, or athletic trainer. Use a heat pack or soak the injury in warm water.  SEEK IMMEDIATE MEDICAL CARE IF: Treatment seems to offer no benefit, or the condition worsens. Any medications produce adverse side effects.  EXERCISES- RANGE OF MOTION (ROM) AND STRETCHING EXERCISES - Plantar Fasciitis (Heel Spur Syndrome) These exercises may help you when beginning to rehabilitate your injury. Your symptoms may resolve with or without further involvement from your physician, physical therapist or athletic trainer. While completing these exercises, remember:  Restoring tissue flexibility helps normal motion to return to the joints. This allows healthier, less painful movement and activity. An effective stretch should be held for at least 30 seconds. A stretch should never be painful. You should only feel a gentle lengthening or release in the stretched tissue.  RANGE OF MOTION - Toe Extension, Flexion Sit with your right / left leg crossed over your opposite knee. Grasp your toes and gently pull them back toward the top of your foot. You should feel a stretch on the bottom of your toes and/or foot. Hold this stretch for 10 seconds. Now, gently pull your toes toward the bottom of your foot. You should feel a stretch on the top of your toes and or foot. Hold this stretch for 10  seconds. Repeat  times. Complete this stretch 3 times per day.   RANGE OF MOTION - Ankle Dorsiflexion, Active Assisted Remove shoes and sit on a chair that is preferably not on a carpeted surface. Place right / left foot under knee. Extend your opposite leg for support. Keeping your heel down, slide your right / left foot back toward the chair until you feel a stretch at your ankle or calf. If you do not feel a stretch, slide your bottom forward to the edge of the chair, while still keeping your heel down. Hold this stretch for 10 seconds. Repeat 3 times. Complete this stretch 2 times per day.   STRETCH  Gastroc, Standing Place hands on wall. Extend right / left leg, keeping the front knee somewhat bent. Slightly point your toes inward on your back foot. Keeping your right / left heel on the floor and your knee straight, shift your weight toward the wall, not allowing your back to arch. You should feel a gentle stretch in the right / left calf. Hold this position for 10 seconds. Repeat 3 times. Complete this stretch 2 times per day.  STRETCH  Soleus, Standing Place hands on wall. Extend right / left leg, keeping the other knee somewhat bent. Slightly point your toes inward on your back foot. Keep your right / left heel on the floor, bend your back knee, and slightly shift your weight over the back leg so that you feel a gentle  stretch deep in your back calf. Hold this position for 10 seconds. Repeat 3 times. Complete this stretch 2 times per day.  STRETCH  Gastrocsoleus, Standing  Note: This exercise can place a lot of stress on your foot and ankle. Please complete this exercise only if specifically instructed by your caregiver.  Place the ball of your right / left foot on a step, keeping your other foot firmly on the same step. Hold on to the wall or a rail for balance. Slowly lift your other foot, allowing your body weight to press your heel down over the edge of the step. You should  feel a stretch in your right / left calf. Hold this position for 10 seconds. Repeat this exercise with a slight bend in your right / left knee. Repeat 3 times. Complete this stretch 2 times per day.   STRENGTHENING EXERCISES - Plantar Fasciitis (Heel Spur Syndrome)  These exercises may help you when beginning to rehabilitate your injury. They may resolve your symptoms with or without further involvement from your physician, physical therapist or athletic trainer. While completing these exercises, remember:  Muscles can gain both the endurance and the strength needed for everyday activities through controlled exercises. Complete these exercises as instructed by your physician, physical therapist or athletic trainer. Progress the resistance and repetitions only as guided.  STRENGTH - Towel Curls Sit in a chair positioned on a non-carpeted surface. Place your foot on a towel, keeping your heel on the floor. Pull the towel toward your heel by only curling your toes. Keep your heel on the floor. Repeat 3 times. Complete this exercise 2 times per day.  STRENGTH - Ankle Inversion Secure one end of a rubber exercise band/tubing to a fixed object (table, pole). Loop the other end around your foot just before your toes. Place your fists between your knees. This will focus your strengthening at your ankle. Slowly, pull your big toe up and in, making sure the band/tubing is positioned to resist the entire motion. Hold this position for 10 seconds. Have your muscles resist the band/tubing as it slowly pulls your foot back to the starting position. Repeat 3 times. Complete this exercises 2 times per day.  Document Released: 10/23/2005 Document Revised: 01/15/2012 Document Reviewed: 02/04/2009 [facility] Patient Information 2014 [place], [place].

## 2023-07-31 ENCOUNTER — Other Ambulatory Visit: Payer: Self-pay | Admitting: Internal Medicine

## 2023-07-31 NOTE — Progress Notes (Unsigned)
Cardiology Office Note Date:  07/31/2023  Patient ID:  Christine Fitzpatrick, Christine Fitzpatrick 1956-09-04, MRN [id number] PCP:  Dana Allan, MD  Cardiologist:  None Electrophysiologist: Lanier Prude, MD    Chief Complaint: increased SVT burden  History of Present Illness: [name] is a 67 y.o. female with PMH notable for SVT, cardiac arrest s/p ICD; seen today for Lanier Prude, MD for acute visit due to increased SVT burden. She is s/p EP study 07/2022 with uninducible arrhythmia. She last saw Dr. Lalla Brothers 09/2022 where she was doing well without complaints.  She messaged office in mid-July 2024 with concerns regarding her recent remote ICD transmission and increased SVT burden. I saw her 06/2023 with c/o increased palpitation episodes. Two week showed brief NSVT and SVT rhythms, symptom trigger episodes associated with PAC/PVCs.  On follow-up today,  *** palpitations *** tolerating toprol BID?     Device Information: MDT single chamber ICD, imp 03/2012; dx cardiac arrest Gen change 01/2021 [facility])  AAD History: Flecainide - in past  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 03/29/2011    Past Surgical History:  Procedure Laterality Date   BREAST EXCISIONAL BIOPSY     CARDIAC DEFIBRILLATOR PLACEMENT     CATARACT EXTRACTION Left    COLONOSCOPY WITH PROPOFOL N/A 06/20/2023   Procedure: COLONOSCOPY WITH PROPOFOL;  Surgeon: Wyline Mood, MD;  Location: [facility] ENDOSCOPY;  Service: Gastroenterology;  Laterality: N/A;   MIDDLE EAR SURGERY Left    POLYPECTOMY  06/20/2023   Procedure: POLYPECTOMY;  Surgeon: Wyline Mood, MD;  Location: [facility] ENDOSCOPY;  Service: Gastroenterology;;   SVT ABLATION N/A 07/18/2022   Procedure: SVT ABLATION;  Surgeon: Lanier Prude, MD;  Location: [facility] INVASIVE CV LAB;  Service: Cardiovascular;   Laterality: N/A;    Current Outpatient Medications  Medication Instructions   acetaminophen (TYLENOL) 500-1,000 mg, Oral, Every 6 hours PRN   azelastine (ASTELIN) 0.1 % nasal spray 2 sprays, Each Nare, Every morning, Use in each nostril as directed   B Complex-C (B-COMPLEX WITH VITAMIN C) tablet 1 tablet, Oral, Daily   cetirizine (ZYRTEC) 10 mg, Oral, Daily PRN   estradiol (ESTRACE) 0.1 MG/GM vaginal cream Discard applicator Apply pea sized amount to tip of finger to urethra before bed. Wash hands well after application. Use Monday, Wednesday and Friday   Evolocumab (REPATHA) 140 MG/ML SOSY INJECT THE CONTENTS OF ONE SYRINGE INTO THE SKIN EVERY 14 DAYS.   MAGNESIUM PO Oral, Daily, Over the counter take by mouth daily    methylPREDNISolone (MEDROL DOSEPAK) 4 mg, Oral   metoprolol succinate (TOPROL-XL) 25 mg, Oral, 2 times daily, Take a 25 mg tablet twice daily   terbinafine (LAMISIL) 1 % cream 1 Application, Topical, 2 times daily, Apply to nail bed   Vitamin D (Ergocalciferol) (DRISDOL) 50,000 Units, Oral, Every 7 days    Social History:  The patient  reports that she has never smoked. She has never used smokeless tobacco. She reports current alcohol use. She reports that she does not use drugs.   Family History:   The patient's family history includes Diabetes in her maternal grandmother; Hypertension in her maternal aunt and mother.  ROS:  Please see the history of present illness. All other systems are reviewed and otherwise negative.   PHYSICAL EXAM:  VS:  There were no vitals taken for  this visit. BMI: There is no height or weight on file to calculate BMI.  GEN- The patient is well appearing, alert and oriented x 3 today.   Lungs- Clear to ausculation bilaterally, normal work of breathing.  Heart- Regular, tachycardic rate and rhythm, no murmurs, rubs or gallops Extremities- No peripheral edema, warm, dry Skin-  device pocket well-healed, no tethering   Device interrogation  done today and reviewed by myself:  Battery 11.7 years Lead thresholds, impedence, sensing stable  One NSVT episode, 1 second in duration Frequent, brief SVT episodes; longest duration 4 minutes, most recently all episodes 1-2 minutes No changes made today  EKG is ordered. Personal review of EKG from today shows:         Recent Labs: 06/15/2023: BUN 10; Creatinine, Ser 0.82; Magnesium 2.1; Potassium 4.2; Sodium 143; TSH 1.670  No results found for requested labs within last 365 days.   CrCl cannot be calculated (Patient's most recent lab result is older than the maximum 21 days allowed.).   Wt Readings from Last 3 Encounters:  07/19/23 190 lb (86.2 kg)  06/20/23 190 lb (86.2 kg)  06/15/23 195 lb (88.5 kg)     Additional studies reviewed include: Previous EP, cardiology notes.   Long term monitor, 07/11/2023 HR 59 - 218, average 84 bpm. 3 NSVT, longest 4 beats. 7 nonsustained SVT, longest 16 beats. Rare supraventricular ectopy. Occasional ventricular ectopy, 2%. No sustained arrhythmias. Symptom trigger episodes correspond to sinus rhythm with PVC/PAC.  30 day Ambulatory monitor, 08/29/2022 HR 62 - 157, average 85 bpm. No atrial fibrillation detected. Occasional ventricular ectopy, 2% Rare supraventricular ectopy, <1%.  Ambulatory monitor, 06/07/2021 HR 57 - 160bpm, average 83 bpm. 5 SVT, longest lasting 4 beats.  Rare supraventricular ectopy. 1.2% PVC burden. No sustained arrhythmias.   ASSESSMENT AND PLAN:  #) SVT #) palpitations Continues to have intermittent palpitation episodes, increasing in frequency Rhythms do not appear to be afib/flutter Increase toprol 25mg  BID. Continue to check BP/pulse regularly. If symptomatic hypotension develops, notify office. If asymptomatic, continue BID dosing. Start OTC magnesium supplement Update electrolytes, thyroid labs 2 week monitor to better assess burden  #) cardiac arrest #) MDT single chamber ICD in situ Very brief  NSVT by device No therapies Device functioning well, see paceart for details       Current medicines are reviewed at length with the patient today.   The patient has concerns regarding her medicines.  The following changes were made today:   none  Labs/ tests ordered today include:  No orders of the defined types were placed in this encounter.    Disposition: Follow up with EP APP  6 weeks    Signed, Sherie Don, NP  07/31/23  3:55 PM  Electrophysiology [facility]

## 2023-08-01 ENCOUNTER — Encounter: Payer: Self-pay | Admitting: Cardiology

## 2023-08-01 ENCOUNTER — Ambulatory Visit: Payer: Medicare Other | Attending: Cardiology | Admitting: Cardiology

## 2023-08-01 VITALS — BP 126/76 | HR 81 | Ht 68.0 in | Wt 192.8 lb

## 2023-08-01 DIAGNOSIS — Z95810 Presence of automatic (implantable) cardiac defibrillator: Secondary | ICD-10-CM | POA: Diagnosis present

## 2023-08-01 DIAGNOSIS — I471 Supraventricular tachycardia, unspecified: Secondary | ICD-10-CM | POA: Insufficient documentation

## 2023-08-01 DIAGNOSIS — I469 Cardiac arrest, cause unspecified: Secondary | ICD-10-CM | POA: Diagnosis present

## 2023-08-01 DIAGNOSIS — I4729 Other ventricular tachycardia: Secondary | ICD-10-CM | POA: Insufficient documentation

## 2023-08-01 LAB — CUP PACEART INCLINIC DEVICE CHECK
Date Time Interrogation Session: 20240925100200
Implantable Lead Connection Status: 753985
Implantable Lead Implant Date: 20130501
Implantable Lead Location: 753860
Implantable Pulse Generator Implant Date: 20220322

## 2023-08-01 MED ORDER — METOPROLOL SUCCINATE ER 25 MG PO TB24: ORAL_TABLET | ORAL | 2 refills | Status: DC

## 2023-08-01 NOTE — Patient Instructions (Signed)
Medication Instructions:  Increase Metoprolol to 25 in the morning, and 50 mg in the evenings.  *If you need a refill on your cardiac medications before your next appointment, please call your pharmacy*   Follow-Up: At [facility], you and your health needs are our priority.  As part of our continuing mission to provide you with exceptional heart care, we have created designated Provider Care Teams.  These Care Teams include your primary Cardiologist (physician) and Advanced Practice Providers (APPs -  Physician Assistants and Nurse Practitioners) who all work together to provide you with the care you need, when you need it.  We recommend signing up for the patient portal called "MyChart".  Sign up information is provided on this After Visit Summary.  MyChart is used to connect with patients for Virtual Visits (Telemedicine).  Patients are able to view lab/test results, encounter notes, upcoming appointments, etc.  Non-urgent messages can be sent to your provider as well.   To learn more about what you can do with MyChart, go to [email or web address].    Your next appointment:   6 month(s)  Provider:   Steffanie Dunn, MD or Sherie Don, NP

## 2023-08-06 ENCOUNTER — Other Ambulatory Visit: Payer: Self-pay

## 2023-08-07 MED ORDER — METOPROLOL SUCCINATE ER 25 MG PO TB24: ORAL_TABLET | ORAL | 2 refills | Status: DC

## 2023-08-08 ENCOUNTER — Encounter: Payer: Self-pay | Admitting: Family Medicine

## 2023-08-08 ENCOUNTER — Other Ambulatory Visit: Payer: Self-pay | Admitting: Family Medicine

## 2023-08-08 DIAGNOSIS — E785 Hyperlipidemia, unspecified: Secondary | ICD-10-CM

## 2023-08-08 DIAGNOSIS — T466X5A Adverse effect of antihyperlipidemic and antiarteriosclerotic drugs, initial encounter: Secondary | ICD-10-CM

## 2023-08-08 MED ORDER — REPATHA 140 MG/ML ~~LOC~~ SOSY
PREFILLED_SYRINGE | SUBCUTANEOUS | 1 refills | Status: DC
Start: 2023-08-08 — End: 2023-09-05

## 2023-08-09 ENCOUNTER — Other Ambulatory Visit (HOSPITAL_COMMUNITY): Payer: Self-pay

## 2023-08-15 ENCOUNTER — Ambulatory Visit (INDEPENDENT_AMBULATORY_CARE_PROVIDER_SITE_OTHER): Payer: Medicare Other

## 2023-08-15 DIAGNOSIS — I471 Supraventricular tachycardia, unspecified: Secondary | ICD-10-CM | POA: Diagnosis not present

## 2023-08-15 LAB — CUP PACEART REMOTE DEVICE CHECK
Battery Remaining Longevity: 138 mo
Battery Voltage: 3.03 V
Brady Statistic RA Percent Paced: INVALID
Brady Statistic RV Percent Paced: 0.04 %
Date Time Interrogation Session: 20241008204455
HighPow Impedance: 44 Ohm
HighPow Impedance: 54 Ohm
Implantable Lead Connection Status: 753985
Implantable Lead Implant Date: 20130501
Implantable Lead Location: 753860
Implantable Pulse Generator Implant Date: 20220322
Lead Channel Impedance Value: 551 Ohm
Lead Channel Impedance Value: 779 Ohm
Lead Channel Pacing Threshold Amplitude: 0.5 V
Lead Channel Pacing Threshold Pulse Width: 0.4 ms
Lead Channel Sensing Intrinsic Amplitude: 6.8 mV
Lead Channel Setting Pacing Amplitude: 1.5 V
Lead Channel Setting Pacing Pulse Width: 0.4 ms
Lead Channel Setting Sensing Sensitivity: 0.45 mV
Zone Setting Status: 755011

## 2023-08-23 MED ORDER — METOPROLOL SUCCINATE ER 25 MG PO TB24: ORAL_TABLET | ORAL | 2 refills | Status: DC

## 2023-08-31 ENCOUNTER — Telehealth: Payer: Self-pay | Admitting: Family Medicine

## 2023-08-31 NOTE — Telephone Encounter (Signed)
[facility] [employer] just called and said they need a prior authorization on Evolocumab (REPATHA) 140 MG/ML SOSY. They said it was faxed it just needs to be signed by provider and sent back.

## 2023-09-05 ENCOUNTER — Other Ambulatory Visit (HOSPITAL_COMMUNITY): Payer: Self-pay

## 2023-09-05 ENCOUNTER — Telehealth: Payer: Self-pay

## 2023-09-05 MED ORDER — REPATHA 140 MG/ML ~~LOC~~ SOSY
PREFILLED_SYRINGE | SUBCUTANEOUS | 1 refills | Status: DC
Start: 2023-09-05 — End: 2023-11-05

## 2023-09-05 NOTE — Telephone Encounter (Signed)
Noted  

## 2023-09-05 NOTE — Progress Notes (Signed)
Remote ICD transmission.   

## 2023-09-05 NOTE — Telephone Encounter (Signed)
*  Primary  Pharmacy Patient Advocate Encounter   Received notification from Pt Calls Messages that prior authorization for Repatha 140MG /ML syringes  is required/requested.   Insurance verification completed.   The patient is insured through [facility] .   Per test claim: PA required; PA submitted to above mentioned insurance via CoverMyMeds Key/confirmation #/EOC [id number] Status is pending

## 2023-09-05 NOTE — Telephone Encounter (Signed)
Test claim shows 28 day supply are covered for $47.00 co-pay. 84 day supply requires PA to be submitted.   PA request has been Submitted. New Encounter created for follow up. For additional info see Pharmacy Prior Auth telephone encounter from 10/30.

## 2023-09-06 ENCOUNTER — Other Ambulatory Visit (HOSPITAL_COMMUNITY): Payer: Self-pay

## 2023-09-06 NOTE — Telephone Encounter (Signed)
Pharmacy Patient Advocate Encounter  Received notification from [facility] that Prior Authorization for Repatha 140MG /ML syringes has been APPROVED from 09/05/2023 to 03/05/2024. Ran test claim, Copay is $141.00 for a 3 month supply. This test claim was processed through [facility]- copay amounts may vary at other pharmacies due to pharmacy/plan contracts, or as the patient moves through the different stages of their insurance plan.   PA #/Case ID/Reference #:  [id number]

## 2023-09-18 ENCOUNTER — Ambulatory Visit: Payer: Medicare Other | Admitting: Orthopedic Surgery

## 2023-09-30 ENCOUNTER — Other Ambulatory Visit: Payer: Self-pay | Admitting: Family Medicine

## 2023-09-30 DIAGNOSIS — R7989 Other specified abnormal findings of blood chemistry: Secondary | ICD-10-CM

## 2023-11-05 ENCOUNTER — Encounter: Payer: Self-pay | Admitting: Family Medicine

## 2023-11-05 DIAGNOSIS — G720 Drug-induced myopathy: Secondary | ICD-10-CM

## 2023-11-05 DIAGNOSIS — E785 Hyperlipidemia, unspecified: Secondary | ICD-10-CM

## 2023-11-05 MED ORDER — REPATHA 140 MG/ML ~~LOC~~ SOSY: PREFILLED_SYRINGE | SUBCUTANEOUS | 1 refills | Status: DC

## 2023-11-14 ENCOUNTER — Ambulatory Visit: Payer: Medicare Other

## 2023-11-14 DIAGNOSIS — I471 Supraventricular tachycardia, unspecified: Secondary | ICD-10-CM | POA: Diagnosis not present

## 2023-11-14 LAB — CUP PACEART REMOTE DEVICE CHECK
Battery Remaining Longevity: 135 mo
Battery Voltage: 3.03 V
Brady Statistic RV Percent Paced: 0.01 %
Date Time Interrogation Session: 20250107220606
HighPow Impedance: 42 Ohm
HighPow Impedance: 50 Ohm
Implantable Lead Connection Status: 753985
Implantable Lead Implant Date: 20130501
Implantable Lead Location: 753860
Implantable Pulse Generator Implant Date: 20220322
Lead Channel Impedance Value: 532 Ohm
Lead Channel Impedance Value: 570 Ohm
Lead Channel Pacing Threshold Amplitude: 0.5 V
Lead Channel Pacing Threshold Pulse Width: 0.4 ms
Lead Channel Sensing Intrinsic Amplitude: 9.5 mV
Lead Channel Setting Pacing Amplitude: 1.5 V
Lead Channel Setting Pacing Pulse Width: 0.4 ms
Lead Channel Setting Sensing Sensitivity: 0.45 mV
Zone Setting Status: 755011

## 2023-12-20 ENCOUNTER — Ambulatory Visit: Payer: Medicare Other | Admitting: Family Medicine

## 2023-12-20 ENCOUNTER — Encounter: Payer: Self-pay | Admitting: Family Medicine

## 2023-12-20 VITALS — BP 120/76 | HR 82 | Temp 98.4°F | Resp 18 | Ht 68.0 in | Wt 198.1 lb

## 2023-12-20 DIAGNOSIS — R110 Nausea: Secondary | ICD-10-CM | POA: Diagnosis not present

## 2023-12-20 DIAGNOSIS — Z23 Encounter for immunization: Secondary | ICD-10-CM | POA: Diagnosis not present

## 2023-12-20 DIAGNOSIS — K648 Other hemorrhoids: Secondary | ICD-10-CM

## 2023-12-20 DIAGNOSIS — R197 Diarrhea, unspecified: Secondary | ICD-10-CM | POA: Diagnosis not present

## 2023-12-20 DIAGNOSIS — I472 Ventricular tachycardia, unspecified: Secondary | ICD-10-CM

## 2023-12-20 LAB — CBC WITH DIFFERENTIAL/PLATELET
Basophils Absolute: 0 10*3/uL (ref 0.0–0.1)
Basophils Relative: 0.6 % (ref 0.0–3.0)
Eosinophils Absolute: 0.2 10*3/uL (ref 0.0–0.7)
Eosinophils Relative: 3.6 % (ref 0.0–5.0)
HCT: 43.6 % (ref 36.0–46.0)
Hemoglobin: 14.2 g/dL (ref 12.0–15.0)
Lymphocytes Relative: 32.9 % (ref 12.0–46.0)
Lymphs Abs: 2.1 10*3/uL (ref 0.7–4.0)
MCHC: 32.6 g/dL (ref 30.0–36.0)
MCV: 97.6 fL (ref 78.0–100.0)
Monocytes Absolute: 0.6 10*3/uL (ref 0.1–1.0)
Monocytes Relative: 9.6 % (ref 3.0–12.0)
Neutro Abs: 3.5 10*3/uL (ref 1.4–7.7)
Neutrophils Relative %: 53.3 % (ref 43.0–77.0)
Platelets: 303 10*3/uL (ref 150.0–400.0)
RBC: 4.47 Mil/uL (ref 3.87–5.11)
RDW: 13.9 % (ref 11.5–15.5)
WBC: 6.5 10*3/uL (ref 4.0–10.5)

## 2023-12-20 LAB — COMPREHENSIVE METABOLIC PANEL
ALT: 19 U/L (ref 0–35)
AST: 19 U/L (ref 0–37)
Albumin: 4.4 g/dL (ref 3.5–5.2)
Alkaline Phosphatase: 84 U/L (ref 39–117)
BUN: 13 mg/dL (ref 6–23)
CO2: 28 meq/L (ref 19–32)
Calcium: 9.6 mg/dL (ref 8.4–10.5)
Chloride: 105 meq/L (ref 96–112)
Creatinine, Ser: 0.91 mg/dL (ref 0.40–1.20)
GFR: 66.23 mL/min (ref >60.00)
Glucose, Bld: 98 mg/dL (ref 70–99)
Potassium: 3.9 meq/L (ref 3.5–5.1)
Sodium: 142 meq/L (ref 135–145)
Total Bilirubin: 0.5 mg/dL (ref 0.2–1.2)
Total Protein: 7.5 g/dL (ref 6.0–8.3)

## 2023-12-20 LAB — TSH: TSH: 1.62 u[IU]/mL (ref 0.35–5.50)

## 2023-12-20 MED ORDER — ONDANSETRON HCL 4 MG PO TABS: 4.0000 mg | ORAL_TABLET | Freq: Three times a day (TID) | ORAL | 0 refills | Status: DC | PRN

## 2023-12-20 NOTE — Progress Notes (Signed)
SUBJECTIVE:   Chief Complaint  Patient presents with   Abdominal Pain    X 4 weeks All over the stomach   Diarrhea   HPI Presents for acute visit  Discussed the use of AI scribe software for clinical note transcription with the patient, who gave verbal consent to proceed.  History of Present Illness Christine Fitzpatrick is a 68 year old female with gastritis who presents with persistent stomach pain and diarrhea.  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 The symptoms began with diarrhea and stomach pain. Initially, she had five to seven bowel movements per day, which have decreased to three or four times a day. The stools are soft and mushy, with no mucus or blood in the stool itself, only on wiping. No fever, recent travel, or antibiotic use in the past three months. She reports some improvement in symptoms, with a recent formed bowel movement, but continues to experience generalized stomach pain, particularly worsening at night. No specific food triggers are identified, and the pain persists regardless of eating or fasting. She experiences bloating and burping, and her sleep is affected by the stomach pain.  She has noticed rectal bleeding associated with the diarrhea, which she associates with her known hemorrhoids. The bleeding is noted only when wiping, not in the stool itself.  She manages her symptoms with Imodium, Pepto Bismol, and Tums, and has made dietary changes. Her cardiologist recently doubled her metoprolol dosage from once to twice daily, but she does not believe this is related to her current symptoms. She has been on Repatha for almost a year.  Her past medical history includes gastritis, and she has undergone a colonoscopy in the past, which revealed diverticulosis and several polyps. No significant weight loss, vomiting, or weakness,  although she notes some nausea and a reduced appetite due to stomach discomfort.      PERTINENT PMH / PSH: As above  OBJECTIVE:  BP 120/76   Pulse 82   Temp 98.4 F (36.9 C)   Resp 18   Ht 5\' 8"  (1.727 m)   Wt 198 lb 2 oz (89.9 kg)   SpO2 98%   BMI 30.12 kg/m    Physical Exam Vitals reviewed.  Constitutional:      General: She is not in acute distress.    Appearance: Normal appearance. She is not ill-appearing, toxic-appearing or diaphoretic.  HENT:     Right Ear: Tympanic membrane, ear canal and external ear normal. There is no impacted cerumen.     Left Ear: Tympanic membrane, ear canal and external ear normal. There is no impacted cerumen.     Nose: Nose normal. No congestion or rhinorrhea.     Mouth/Throat:     Mouth: Mucous membranes are moist.     Pharynx: Oropharynx is clear. No oropharyngeal exudate or posterior oropharyngeal erythema.  Eyes:     General:        Right eye: No discharge.        Left eye: No discharge.     Conjunctiva/sclera: Conjunctivae normal.  Cardiovascular:     Rate and Rhythm: Normal rate and regular rhythm.     Heart sounds: Normal heart sounds.  Pulmonary:     Effort: Pulmonary effort is normal.     Breath sounds: Normal breath sounds.  Abdominal:     General: Bowel sounds are normal. There is  no distension or abdominal bruit.     Palpations: Abdomen is soft. There is no hepatomegaly or splenomegaly.     Tenderness: There is no abdominal tenderness. There is no guarding or rebound. Negative signs include Murphy's sign and McBurney's sign.  Musculoskeletal:        General: Normal range of motion.  Lymphadenopathy:     Cervical: No cervical adenopathy.  Skin:    General: Skin is warm and dry.  Neurological:     General: No focal deficit present.     Mental Status: She is alert and oriented to person, place, and time. Mental status is at baseline.  Psychiatric:        Mood and Affect: Mood normal.        Behavior: Behavior normal.         Thought Content: Thought content normal.        Judgment: Judgment normal.           12/20/2023    8:46 AM 06/07/2022   10:16 AM 06/06/2021    9:07 AM  Depression screen PHQ 2/9  Decreased Interest 0 0 0  Down, Depressed, Hopeless 0 0 0  PHQ - 2 Score 0 0 0  Altered sleeping 0    Tired, decreased energy 0    Change in appetite 0    Feeling bad or failure about yourself  0    Trouble concentrating 0    Moving slowly or fidgety/restless 0    Suicidal thoughts 0    PHQ-9 Score 0    Difficult doing work/chores Not difficult at all        12/20/2023    8:47 AM  GAD 7 : Generalized Anxiety Score  Nervous, Anxious, on Edge 0  Control/stop worrying 0  Worry too much - different things 0  Trouble relaxing 0  Restless 0  Easily annoyed or irritable 0  Afraid - awful might happen 0  Total GAD 7 Score 0  Anxiety Difficulty Not difficult at all    ASSESSMENT/PLAN:  Diarrhea, unspecified type Assessment & Plan: Likely viral gastroenteritis with symptoms of diarrhea, abdominal pain, and bloating for approximately one month. Symptoms appear to be improving. No recent travel or antibiotic use. 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 -Order blood work including CBC, electrolytes, and TSH. -Collect stool samples for culture and parasite testing. -Advise patient to maintain hydration -Prescribe Zofran for nausea as needed. -If symptoms worsen or patient develops fever, consider ordering abdominal CT scan.  Orders: -     CBC with Differential/Platelet -     Comprehensive metabolic panel -     TSH -     GI pathogen panel by PCR, stool -     Fecal occult blood, imunochemical; Future -     Stool culture -     Ondansetron HCl; Take 1 tablet (4 mg total) by mouth every 8 (eight) hours as needed for nausea or vomiting.  Dispense: 20 tablet; Refill: 0  Encounter for immunization -     Pneumococcal conjugate vaccine 20-valent  Nausea Assessment &  Plan: -Zofran as needed in the setting of acute viral gastroenteritis.  No vomiting. -Adequately hydrating  Orders: -     Ondansetron HCl; Take 1 tablet (4 mg total) by mouth every 8 (eight) hours as needed for nausea or vomiting.  Dispense: 20 tablet; Refill: 0  Ventricular tachycardia ([name]) Assessment & Plan: Recent increase in Metoprolol dosage from once daily to twice daily  by cardiologist. No reported side effects. -Continue current management.    Internal hemorrhoid Assessment & Plan: History of hemorrhoids with rectal bleeding triggered by diarrhea. -Continue current management. -Check FOBT     PDMP reviewed  Return if symptoms worsen or fail to improve, for PCP.  Dana Allan, MD

## 2023-12-20 NOTE — Patient Instructions (Addendum)
It was a pleasure meeting you today. Thank you for allowing me to take part in your health care.  Our goals for today as we discussed include:  We will get some labs today.  If they are abnormal or we need to do something about them, I will call you.  If they are normal, I will send you a message on MyChart (if it is active) or a letter in the mail.  If you don't hear from [place] in 2 weeks, please call the office at the number below.   Collect stool samples for evaluation  Zofran for nausea  Continue to hydrate.   If develops fevers, worsening diarrhea or abdominal pain notify MD  This is a list of the screening recommended for you and due dates:  Health Maintenance  Topic Date Due   Medicare Annual Wellness Visit  Never done   COVID-19 Vaccine (5 - 2024-25 season) 07/08/2023   Pneumonia Vaccine (1 of 1 - PCV) Never done   DEXA scan (bone density measurement)  Never done   Mammogram  01/25/2024   Colon Cancer Screening  06/19/2026   Pap with HPV screening  04/04/2028   DTaP/Tdap/Td vaccine (6 - Td or Tdap) 01/08/2029   Flu Shot  Completed   Hepatitis C Screening  Completed   HIV Screening  Completed   Zoster (Shingles) Vaccine  Completed   HPV Vaccine  Aged Out      If you have any questions or concerns, please do not hesitate to call the office at [phone].  I look forward to our next visit and until then take care and stay safe.  Regards,   Dana Allan, MD   [facility]

## 2023-12-26 ENCOUNTER — Encounter: Payer: Self-pay | Admitting: Family Medicine

## 2023-12-26 DIAGNOSIS — Z1231 Encounter for screening mammogram for malignant neoplasm of breast: Secondary | ICD-10-CM | POA: Insufficient documentation

## 2023-12-26 DIAGNOSIS — R197 Diarrhea, unspecified: Secondary | ICD-10-CM | POA: Insufficient documentation

## 2023-12-26 DIAGNOSIS — Z5181 Encounter for therapeutic drug level monitoring: Secondary | ICD-10-CM | POA: Insufficient documentation

## 2023-12-26 DIAGNOSIS — Z23 Encounter for immunization: Secondary | ICD-10-CM | POA: Insufficient documentation

## 2023-12-26 DIAGNOSIS — R110 Nausea: Secondary | ICD-10-CM | POA: Insufficient documentation

## 2023-12-26 NOTE — Assessment & Plan Note (Signed)
>>  ASSESSMENT AND PLAN FOR VENTRICULAR TACHYCARDIA ([name]) WRITTEN ON 12/26/2023 12:49 PM BY WALSH, TANYA, MD  Recent increase in Metoprolol  dosage from once daily to twice daily by cardiologist. No reported side effects. -Continue current management.

## 2023-12-26 NOTE — Assessment & Plan Note (Signed)
History of hemorrhoids with rectal bleeding triggered by diarrhea. -Continue current management. -Check FOBT

## 2023-12-26 NOTE — Assessment & Plan Note (Addendum)
Likely viral gastroenteritis with symptoms of diarrhea, abdominal pain, and bloating for approximately one month. Symptoms appear to be improving. No recent travel or antibiotic use. 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 -Order blood work including CBC, electrolytes, and TSH. -Collect stool samples for culture and parasite testing. -Advise patient to maintain hydration -Prescribe Zofran for nausea as needed. -If symptoms worsen or patient develops fever, consider ordering abdominal CT scan.

## 2023-12-26 NOTE — Assessment & Plan Note (Signed)
Recent increase in Metoprolol dosage from once daily to twice daily by cardiologist. No reported side effects. -Continue current management.

## 2023-12-26 NOTE — Assessment & Plan Note (Signed)
-  Zofran as needed in the setting of acute viral gastroenteritis.  No vomiting. -Adequately hydrating

## 2023-12-27 NOTE — Progress Notes (Signed)
 Remote ICD transmission.

## 2024-01-06 ENCOUNTER — Encounter: Payer: Self-pay | Admitting: Family Medicine

## 2024-01-07 ENCOUNTER — Other Ambulatory Visit: Payer: Self-pay | Admitting: *Deleted

## 2024-01-07 DIAGNOSIS — R197 Diarrhea, unspecified: Secondary | ICD-10-CM

## 2024-01-07 NOTE — Addendum Note (Signed)
 Addended by: Warden Fillers on: 01/07/2024 03:09 PM   Modules accepted: Orders

## 2024-01-07 NOTE — Addendum Note (Signed)
 Addended by: Warden Fillers on: 01/07/2024 03:00 PM   Modules accepted: Orders

## 2024-01-07 NOTE — Addendum Note (Signed)
 Addended by: Warden Fillers on: 01/07/2024 01:15 PM   Modules accepted: Orders

## 2024-01-07 NOTE — Addendum Note (Signed)
 Addended by: Warden Fillers on: 01/07/2024 04:28 PM   Modules accepted: Orders

## 2024-01-08 ENCOUNTER — Ambulatory Visit (INDEPENDENT_AMBULATORY_CARE_PROVIDER_SITE_OTHER)

## 2024-01-08 DIAGNOSIS — R197 Diarrhea, unspecified: Secondary | ICD-10-CM

## 2024-01-08 LAB — FECAL OCCULT BLOOD, IMMUNOCHEMICAL: Fecal Occult Bld: POSITIVE — AB

## 2024-01-11 ENCOUNTER — Encounter: Payer: Self-pay | Admitting: Family Medicine

## 2024-01-11 ENCOUNTER — Telehealth: Payer: Self-pay

## 2024-01-11 NOTE — Telephone Encounter (Signed)
 Left message to return call to our office.  Calling to see if pt is ok with a GI referral

## 2024-01-11 NOTE — Telephone Encounter (Signed)
-----   Message from Dana Allan sent at 01/10/2024 12:02 PM EST ----- Recommend Gi Referral

## 2024-01-12 LAB — GASTROINTESTINAL PATHOGEN PNL
CampyloBacter Group: NOT DETECTED
Norovirus GI/GII: NOT DETECTED
Rotavirus A: NOT DETECTED
Salmonella species: NOT DETECTED
Shiga Toxin 1: NOT DETECTED
Shiga Toxin 2: NOT DETECTED
Shigella Species: NOT DETECTED
Vibrio Group: NOT DETECTED
Yersinia enterocolitica: NOT DETECTED

## 2024-01-12 LAB — SALMONELLA/SHIGELLA CULT, CAMPY EIA AND SHIGA TOXIN RFL ECOLI
MICRO NUMBER: 16150620
MICRO NUMBER:: 16150621
MICRO NUMBER:: 16150622
Result:: NOT DETECTED
SHIGA RESULT:: NOT DETECTED
SPECIMEN QUALITY: ADEQUATE
SPECIMEN QUALITY:: ADEQUATE
SPECIMEN QUALITY:: ADEQUATE

## 2024-01-12 LAB — TIQ-NTM

## 2024-01-14 ENCOUNTER — Other Ambulatory Visit: Payer: Self-pay

## 2024-01-14 DIAGNOSIS — R197 Diarrhea, unspecified: Secondary | ICD-10-CM

## 2024-01-14 DIAGNOSIS — R110 Nausea: Secondary | ICD-10-CM

## 2024-01-16 ENCOUNTER — Encounter: Payer: Self-pay | Admitting: Podiatry

## 2024-01-16 ENCOUNTER — Ambulatory Visit (INDEPENDENT_AMBULATORY_CARE_PROVIDER_SITE_OTHER): Payer: Medicare Other | Admitting: Podiatry

## 2024-01-16 DIAGNOSIS — B351 Tinea unguium: Secondary | ICD-10-CM

## 2024-01-16 NOTE — Progress Notes (Signed)
  Subjective:  Patient ID: Christine Fitzpatrick, female    DOB: 19-Sep-1956,  MRN: [id number]  Chief Complaint  Patient presents with   Nail Problem    "I guess it's doing alright."    68 y.o. female presents with the above complaint. History confirmed with patient.  She notes quite a bit of improvement no longer need to use the cream  Objective:  Physical Exam: warm, good capillary refill, no trophic changes or ulcerative lesions, normal DP and PT pulses, and normal sensory exam. Left Foot: Hallux has good growth with minimal dystrophy     Assessment:   1. Onychomycosis      Plan:  Patient was evaluated and treated and all questions answered.  Doing very well has good regrowth 6 months out with minimal residual onychomycosis there is some dystrophy from the regrowth.  Allow this to continue to grow out further she may resume pedicures I debrided the dystrophic portions that were loose today.  No further antifungal medications.  Follow-up as needed for this.  Return if symptoms worsen or fail to improve.

## 2024-01-24 ENCOUNTER — Ambulatory Visit
Admission: RE | Admit: 2024-01-24 | Discharge: 2024-01-24 | Disposition: A | Discharge status: Home / Self Care | Source: Ambulatory Visit | Attending: Emergency Medicine | Admitting: Emergency Medicine

## 2024-01-24 ENCOUNTER — Telehealth: Payer: Self-pay | Admitting: Gastroenterology

## 2024-01-24 ENCOUNTER — Other Ambulatory Visit: Payer: Self-pay

## 2024-01-24 VITALS — BP 131/77 | HR 77 | Temp 98.4°F | Resp 16

## 2024-01-24 DIAGNOSIS — R300 Dysuria: Secondary | ICD-10-CM | POA: Insufficient documentation

## 2024-01-24 LAB — POCT URINALYSIS DIP (MANUAL ENTRY)
Bilirubin, UA: NEGATIVE
Blood, UA: NEGATIVE
Glucose, UA: NEGATIVE mg/dL
Ketones, POC UA: NEGATIVE mg/dL
Leukocytes, UA: NEGATIVE
Nitrite, UA: NEGATIVE
Protein Ur, POC: NEGATIVE mg/dL
Spec Grav, UA: 1.015
Urobilinogen, UA: 0.2 U/dL
pH, UA: 6.5

## 2024-01-24 MED ORDER — NITROFURANTOIN MONOHYD MACRO 100 MG PO CAPS: 100.0000 mg | ORAL_CAPSULE | Freq: Two times a day (BID) | ORAL | 0 refills | Status: DC

## 2024-01-24 NOTE — Discharge Instructions (Signed)
 Your urinalysis does not show infection at this time, your urine will be sent to the lab to determine exactly which bacteria is present, if any changes need to be made to your medications you will be notified  Begin use of every morning and every evening for 5 days  Vaginal swab checking for yeast and BV is pending 2 to 3 days, you will be notified of positive test results only and medication sent at time of notification  You may use over-the-counter Azo to help minimize your symptoms until antibiotic removes bacteria, this medication will turn your urine orange  Increase your fluid intake through use of water  As always practice good hygiene, wiping front to back and avoidance of scented vaginal products to prevent further irritation  If symptoms continue to persist after use of medication or recur please follow-up with urgent care or your primary doctor as needed

## 2024-01-24 NOTE — Telephone Encounter (Signed)
 The patient left a voicemail stating that she has a referral from her primary care provider, Christine Fitzpatrick. She reported symptoms of diarrhea, constipation, and abdominal pain and has a history of chronic gastrointestinal issues. The patient mentioned that her provider has conducted all necessary testing and believes it is best for her to see a gastroenterology specialist. Additionally, the patient shared that Dr. Tobi Bastos performed a procedure six months ago, diagnosing her with diverticulitis. She also noted that she has taken Imodium.  I returned her call to acknowledge receipt of her voicemail and informed her that the message had been forwarded to the nurse. I advised her that if the nurse does not respond, she should expect a callback within 24 to 48 hours.

## 2024-01-24 NOTE — ED Triage Notes (Signed)
 PAtient presents to [facility] for evluation of lower abdominal pressure (intermittent) and burning with urination.  Denis discharge.

## 2024-01-24 NOTE — ED Provider Notes (Signed)
 Christine Fitzpatrick    CSN: [id number] Arrival date & time: 01/24/24  1628      History   Chief Complaint Chief Complaint  Patient presents with   Urinary Frequency    Burning and pain with urination - Entered by patient   Dysuria         HPI Christine Fitzpatrick is a 68 y.o. female.   Patient presents for evaluation of urinary frequency, dysuria, lower abdominal pain and right flank pain present for 3 days.  Has attempted use of U-Cort and Tylenol which has been somewhat helpful but symptoms persisted.  Denies hematuria, fever or vaginal symptoms.  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 03/29/2011    Patient Active Problem List   Diagnosis Date Noted   Diarrhea 12/26/2023   Encounter for immunization 12/26/2023   Nausea 12/26/2023   Adenomatous polyp of colon 06/20/2023   Postmenopausal estrogen deficiency 04/08/2023   Breast cancer screening by mammogram 04/08/2023   Colon cancer screening 04/08/2023   Post-menopausal atrophic vaginitis 04/08/2023   Cervical cancer screening 04/08/2023   Encounter for cervical Pap smear with pelvic exam 04/08/2023   Low serum vitamin D 04/08/2023   Arthritis of both knees 07/05/2022   Back complaints 07/05/2022   Gastritis 07/05/2022   Hyperlipidemia 06/07/2022   Flank pain 04/17/2022   Annual physical exam 11/21/2021   Statin intolerance 11/21/2021   Primary osteoarthritis of left knee 06/14/2021   History of cardiac arrest 06/06/2021   Ventricular tachycardia ([name]) 06/06/2021   Supraventricular tachycardia ([name]) 06/06/2021   Prediabetes 03/31/2021   Patent pressure equalization (PE) tube 02/25/2021   Status post intraocular lens implant 02/25/2021   Elevated hemoglobin A1c 01/24/2021   Bursitis of both hips 12/07/2020   Simple renal cyst 04/15/2019   Axillary  lymphadenopathy 09/06/2017   History of colonic polyps 06/01/2017   Internal hemorrhoid 06/01/2017   Syncope 06/02/2016   Lumpy breasts, right 05/06/2016   Neck stiffness 11/19/2015   Neck pain 10/22/2015   Presence of automatic (implantable) cardiac defibrillator 09/23/2014   Impaired fasting glucose 08/23/2014   Sciatica 06/19/2013   Ventricular fibrillation ([name]) 02/29/2012   Other tear of cartilage or meniscus of knee, current 06/26/2011   History of lumbar laminectomy 03/07/2011   Leiomyoma of uterus, unspecified 12/04/2006   ESR raised 03/17/2005    Past Surgical History:  Procedure Laterality Date   BREAST EXCISIONAL BIOPSY     CARDIAC DEFIBRILLATOR PLACEMENT     CATARACT EXTRACTION Left    COLONOSCOPY WITH PROPOFOL N/A 06/20/2023   Procedure: COLONOSCOPY WITH PROPOFOL;  Surgeon: Wyline Mood, MD;  Location: [facility] ENDOSCOPY;  Service: Gastroenterology;  Laterality: N/A;   MIDDLE EAR SURGERY Left    POLYPECTOMY  06/20/2023   Procedure: POLYPECTOMY;  Surgeon: Wyline Mood, MD;  Location: [facility] ENDOSCOPY;  Service: Gastroenterology;;   SVT ABLATION N/A 07/18/2022   Procedure: SVT ABLATION;  Surgeon: Lanier Prude, MD;  Location: [facility] INVASIVE CV LAB;  Service: Cardiovascular;  Laterality: N/A;    OB History   No obstetric history on file.      Home Medications    Prior to Admission medications   Medication Sig Start Date End Date Taking? Authorizing Provider  nitrofurantoin, macrocrystal-monohydrate, (MACROBID) 100 MG capsule Take 1 capsule (100 mg total) by mouth 2 (two) times daily. 01/24/24  Yes  Oluwaseyi Raffel R, NP  acetaminophen (TYLENOL) 500 MG tablet Take 500-1,000 mg by mouth every 6 (six) hours as needed (pain.).    [provider]  azelastine (ASTELIN) 0.1 % nasal spray Place 2 sprays into both nostrils in the morning. Use in each nostril as directed    [provider]  B Complex-C (B-COMPLEX WITH VITAMIN C) tablet Take 1 tablet by mouth daily  in the afternoon.    [provider]  cetirizine (ZYRTEC) 10 MG tablet Take 10 mg by mouth daily as needed for allergies.    [provider]  estradiol (ESTRACE) 0.1 MG/GM vaginal cream Discard applicator Apply pea sized amount to tip of finger to urethra before bed. Wash hands well after application. Use Monday, Wednesday and Friday 02/07/23   Vanna Scotland, MD  Evolocumab (REPATHA) 140 MG/ML SOSY INJECT THE CONTENTS OF ONE SYRINGE INTO THE SKIN EVERY 14 DAYS. 11/05/23   Dana Allan, MD  MAGNESIUM PO Take by mouth daily. Over the counter take by mouth daily    [provider]  methylPREDNISolone (MEDROL DOSEPAK) 4 MG TBPK tablet Take 4 mg by mouth. Patient not taking: Reported on 01/16/2024 04/04/23   [provider]  metoprolol succinate (TOPROL-XL) 25 MG 24 hr tablet Take 25 mg (1 tablet) in the morning, and 25 (1 tablet) mg in the evening. 08/23/23   Sherie Don, NP  ondansetron (ZOFRAN) 4 MG tablet Take 1 tablet (4 mg total) by mouth every 8 (eight) hours as needed for nausea or vomiting. Patient not taking: Reported on 01/16/2024 12/20/23   Dana Allan, MD  Vitamin D, Ergocalciferol, (DRISDOL) 1.25 MG (50000 UNIT) CAPS capsule TAKE 1 CAPSULE (50,000 UNITS TOTAL) BY MOUTH EVERY 7 (SEVEN) DAYS 10/03/23   Dana Allan, MD    Family History Family History  Problem Relation Age of Onset   Hypertension Mother    Hypertension Maternal Aunt    Diabetes Maternal Grandmother    Breast cancer Neg Hx     Social History Social History   Tobacco Use   Smoking status: Never   Smokeless tobacco: Never  Vaping Use   Vaping status: Never Used  Substance Use Topics   Alcohol use: Yes   Drug use: Never     Allergies   Codeine, Morphine and codeine, Sulfa antibiotics, and Sulfamethoxazole-trimethoprim   Review of Systems Review of Systems   Physical Exam Triage Vital Signs ED Triage Vitals  Encounter Vitals Group     BP 01/24/24 1655 131/77      Systolic BP Percentile --      Diastolic BP Percentile --      Pulse Rate 01/24/24 1655 77     Resp 01/24/24 1655 16     Temp 01/24/24 1655 98.4 F (36.9 C)     Temp Source 01/24/24 1655 Oral     SpO2 01/24/24 1655 96 %     Weight --      Height --      Head Circumference --      Peak Flow --      Pain Score 01/24/24 1701 3     Pain Loc --      Pain Education --      Exclude from Growth Chart --    No data found.  Updated Vital Signs BP 131/77 (BP Location: Left Arm)   Pulse 77   Temp 98.4 F (36.9 C) (Oral)   Resp 16   SpO2 96%   Visual Acuity Right  Eye Distance:   Left Eye Distance:   Bilateral Distance:    Right Eye Near:   Left Eye Near:    Bilateral Near:     Physical Exam Constitutional:      Appearance: Normal appearance.  Eyes:     Extraocular Movements: Extraocular movements intact.  Pulmonary:     Effort: Pulmonary effort is normal.  Abdominal:     General: Abdomen is flat. Bowel sounds are normal.     Palpations: Abdomen is soft.     Tenderness: There is no abdominal tenderness. There is no right CVA tenderness, left CVA tenderness or guarding.  Genitourinary:    Comments: deferred Neurological:     Mental Status: She is alert and oriented to person, place, and time. Mental status is at baseline.      UC Treatments / Results  Labs (all labs ordered are listed, but only abnormal results are displayed) Labs Reviewed  POCT URINALYSIS DIP (MANUAL ENTRY) - Normal  URINE CULTURE  CERVICOVAGINAL ANCILLARY ONLY    EKG   Radiology No results found.  Procedures Procedures (including critical care time)  Medications Ordered in UC Medications - No data to display  Initial Impression / Assessment and Plan / UC Course  I have reviewed the triage vital signs and the nursing notes.  Pertinent labs & imaging results that were available during my care of the patient were reviewed by me and considered in my medical decision making (see chart for  details).  Dysuria  Urinalysis is negative, sent for culture, patient symptomatic so we will initiate treatment, prescribed Macrobid, vaginal swab checking for yeast and BV pending, will treat per protocol, recommended additional supportive care with follow-up as needed Final Clinical Impressions(s) / UC Diagnoses   Final diagnoses:  Dysuria     Discharge Instructions      Your urinalysis does not show infection at this time, your urine will be sent to the lab to determine exactly which bacteria is present, if any changes need to be made to your medications you will be notified  Begin use of every morning and every evening for 5 days  Vaginal swab checking for yeast and BV is pending 2 to 3 days, you will be notified of positive test results only and medication sent at time of notification  You may use over-the-counter Azo to help minimize your symptoms until antibiotic removes bacteria, this medication will turn your urine orange  Increase your fluid intake through use of water  As always practice good hygiene, wiping front to back and avoidance of scented vaginal products to prevent further irritation  If symptoms continue to persist after use of medication or recur please follow-up with urgent care or your primary doctor as needed    ED Prescriptions     Medication Sig Dispense Auth. Provider   nitrofurantoin, macrocrystal-monohydrate, (MACROBID) 100 MG capsule Take 1 capsule (100 mg total) by mouth 2 (two) times daily. 10 capsule Valinda Hoar, NP      PDMP not reviewed this encounter.   Valinda Hoar, NP 01/24/24 [phone]

## 2024-01-25 LAB — CERVICOVAGINAL ANCILLARY ONLY
Bacterial Vaginitis (gardnerella): NEGATIVE
Candida Glabrata: NEGATIVE
Candida Vaginitis: NEGATIVE
Comment: NEGATIVE
Comment: NEGATIVE
Comment: NEGATIVE

## 2024-01-26 LAB — URINE CULTURE

## 2024-01-26 NOTE — Progress Notes (Unsigned)
 Electrophysiology Clinic Note    Date:  01/28/2024  Patient ID:  Christine, Fitzpatrick 26-Apr-1956, MRN [id number] PCP:  Dana Allan, MD  Cardiologist:  None Electrophysiologist: Lanier Prude, MD   Discussed the use of AI scribe software for clinical note transcription with the patient, who gave verbal consent to proceed.   Patient Profile    Chief Complaint: SVT follow-up  History of Present Illness: Christine Fitzpatrick is a 68 y.o. female with PMH notable for SVT, cardiac arrest/ VT s/p ICD; seen today for Lanier Prude, MD for routine electrophysiology followup.  She is s/p EP study 07/2022 where SVT could not be induced.   Last saw Dr. Lalla Brothers 09/2022 where she was doing well. I saw her for acute visit 06/2023 when she noted increased SVT burden on ICD remote reports. BB was increased, 2 week zio with triggered episodes corresponding to [facility], PVCs. She felt better at 07/2023 follow-up  On follow-up today, her palpitation episodes have significantly improved with the addition of BID toprol dosing. She does continue to have rare episodes.  Her BP runs 110s systolic at home She has had stomach discomfort lately, is waiting to get in with GI. She has been drinking more ginger tea to settle her stomach, and thinks the increased tea intake has triggered increased palpitation episodes.   She denies dizziness, LH, presyncope. No chest pain or pressure.    Arrhythmia/Device History MDT single chamber ICD, imp 03/2012; dx Cardiac arrest  - gen change 2022  AAD - Flec previously     ROS:  Please see the history of present illness. All other systems are reviewed and otherwise negative.    Physical Exam    VS:  BP 133/86 (BP Location: Left Arm, Patient Position: Sitting, Cuff Size: Normal)   Pulse 91   Ht 5\' 8"  (1.727 m)   Wt 194 lb 12.8 oz (88.4 kg)   SpO2 96%   BMI 29.62 kg/m  BMI: Body mass index is 29.62 kg/m.  Wt Readings from Last 3 Encounters:  01/28/24 194 lb 12.8 oz  (88.4 kg)  12/20/23 198 lb 2 oz (89.9 kg)  08/01/23 192 lb 12.8 oz (87.5 kg)     GEN- The patient is well appearing, alert and oriented x 3 today.   Lungs- Clear to ausculation bilaterally, normal work of breathing.  Heart- Regular rate and rhythm, no murmurs, rubs or gallops Extremities- No peripheral edema, warm, dry Skin-   device pocket well-healed, no tethering   Device interrogation done today and reviewed by myself:  Battery 11 years Lead thresholds, impedence, sensing stable  Brief, infrequent SVT episodes, less than 30sec in duration One brief NSVT epsiode No changes made today   Studies Reviewed   Previous EP, cardiology notes.    EKG is ordered. Personal review of EKG from today shows:    EKG Interpretation Date/Time:  Monday January 28 2024 10:41:14 EDT Ventricular Rate:  91 PR Interval:  164 QRS Duration:  90 QT Interval:  372 QTC Calculation: 457 R Axis:   6  Text Interpretation: Normal sinus rhythm Minimal voltage criteria for LVH, may be normal variant ( [employer] product ) Confirmed by Sherie Don [phone]) on 01/28/2024 10:50:39 AM    Long term monitor, 07/11/2023 HR 59 - 218, average 84 bpm. 3 NSVT, longest 4 beats. 7 nonsustained SVT, longest 16 beats. Rare supraventricular ectopy. Occasional ventricular ectopy, 2%. No sustained arrhythmias. Symptom trigger episodes correspond to sinus rhythm with PVC/PAC.  30 day Ambulatory monitor, 08/29/2022 HR 62 - 157, average 85 bpm. No atrial fibrillation detected. Occasional ventricular ectopy, 2% Rare supraventricular ectopy, <1%.   Ambulatory monitor, 06/07/2021 HR 57 - 160bpm, average 83 bpm. 5 SVT, longest lasting 4 beats.  Rare supraventricular ectopy. 1.2% PVC burden. No sustained arrhythmias.     Assessment and Plan     #) SVT #) PAC #) PVC #) palpitations  Unable to induce SVT during 2023 EPS Low SVT burden via device Improved symptomatic burden with increase toprol Continue 25mg   toprol BID   #) cardiac arrest s/p ICD Very brief NSVT episode, no VT/VF No HV therapies Low VP         Current medicines are reviewed at length with the patient today.   The patient has concerns regarding her medicines.  The following changes were made today:  none  Labs/ tests ordered today include:  Orders Placed This Encounter  Procedures   EKG 12-Lead     Disposition: Follow up with Dr. Lalla Brothers or EP APP in 6 months   Signed, Sherie Don, NP  01/28/24  12:07 PM  Electrophysiology [facility]

## 2024-01-28 ENCOUNTER — Encounter: Payer: Self-pay | Admitting: Emergency Medicine

## 2024-01-28 ENCOUNTER — Ambulatory Visit: Payer: Medicare Other | Attending: Cardiology | Admitting: Cardiology

## 2024-01-28 VITALS — BP 133/86 | HR 91 | Ht 68.0 in | Wt 194.8 lb

## 2024-01-28 DIAGNOSIS — Z95810 Presence of automatic (implantable) cardiac defibrillator: Secondary | ICD-10-CM | POA: Diagnosis not present

## 2024-01-28 DIAGNOSIS — I471 Supraventricular tachycardia, unspecified: Secondary | ICD-10-CM | POA: Diagnosis not present

## 2024-01-28 DIAGNOSIS — Z79899 Other long term (current) drug therapy: Secondary | ICD-10-CM | POA: Insufficient documentation

## 2024-01-28 DIAGNOSIS — R300 Dysuria: Secondary | ICD-10-CM

## 2024-01-28 DIAGNOSIS — Z8674 Personal history of sudden cardiac arrest: Secondary | ICD-10-CM | POA: Insufficient documentation

## 2024-01-28 LAB — CUP PACEART INCLINIC DEVICE CHECK
Date Time Interrogation Session: 20250324121724
Implantable Lead Connection Status: 753985
Implantable Lead Implant Date: 20130501
Implantable Lead Location: 753860
Implantable Pulse Generator Implant Date: 20220322

## 2024-01-28 NOTE — Patient Instructions (Signed)
 Medication Instructions:  The current medical regimen is effective;  continue present plan and medications.  *If you need a refill on your cardiac medications before your next appointment, please call your pharmacy*   Follow-Up: At [facility], you and your health needs are our priority.  As part of our continuing mission to provide you with exceptional heart care, we have created designated Provider Care Teams.  These Care Teams include your primary Cardiologist (physician) and Advanced Practice Providers (APPs -  Physician Assistants and Nurse Practitioners) who all work together to provide you with the care you need, when you need it.  We recommend signing up for the patient portal called "MyChart".  Sign up information is provided on this After Visit Summary.  MyChart is used to connect with patients for Virtual Visits (Telemedicine).  Patients are able to view lab/test results, encounter notes, upcoming appointments, etc.  Non-urgent messages can be sent to your provider as well.   To learn more about what you can do with MyChart, go to [email or web address].    Your next appointment:   6 month(s)  Provider:   Steffanie Dunn, MD or Sherie Don, NP

## 2024-01-29 ENCOUNTER — Ambulatory Visit
Admission: RE | Admit: 2024-01-29 | Discharge: 2024-01-29 | Disposition: A | Discharge status: Home / Self Care | Payer: Medicare Other | Source: Ambulatory Visit | Attending: Family Medicine | Admitting: Family Medicine

## 2024-01-29 DIAGNOSIS — Z780 Asymptomatic menopausal state: Secondary | ICD-10-CM | POA: Insufficient documentation

## 2024-01-29 DIAGNOSIS — Z1231 Encounter for screening mammogram for malignant neoplasm of breast: Secondary | ICD-10-CM

## 2024-01-29 LAB — URINE CULTURE: Culture: NO GROWTH

## 2024-02-02 ENCOUNTER — Encounter: Payer: Self-pay | Admitting: Cardiology

## 2024-02-06 ENCOUNTER — Encounter: Payer: Self-pay | Admitting: Family Medicine

## 2024-02-07 NOTE — Telephone Encounter (Signed)
 Appointment scheduled for 02/18/2024 at 1:45 PM. Patient notified.

## 2024-02-13 ENCOUNTER — Ambulatory Visit (INDEPENDENT_AMBULATORY_CARE_PROVIDER_SITE_OTHER): Payer: Managed Care, Other (non HMO)

## 2024-02-13 DIAGNOSIS — I471 Supraventricular tachycardia, unspecified: Secondary | ICD-10-CM | POA: Diagnosis not present

## 2024-02-13 LAB — CUP PACEART REMOTE DEVICE CHECK
Battery Remaining Longevity: 133 mo
Battery Voltage: 3.03 V
Brady Statistic RA Percent Paced: INVALID
Brady Statistic RV Percent Paced: 0 %
Date Time Interrogation Session: 20250408193503
HighPow Impedance: 38 Ohm
HighPow Impedance: 49 Ohm
Implantable Lead Connection Status: 753985
Implantable Lead Implant Date: 20130501
Implantable Lead Location: 753860
Implantable Pulse Generator Implant Date: 20220322
Lead Channel Impedance Value: 475 Ohm
Lead Channel Impedance Value: 570 Ohm
Lead Channel Pacing Threshold Amplitude: 0.5 V
Lead Channel Pacing Threshold Pulse Width: 0.4 ms
Lead Channel Sensing Intrinsic Amplitude: 9.4 mV
Lead Channel Setting Pacing Amplitude: 1.5 V
Lead Channel Setting Pacing Pulse Width: 0.4 ms
Lead Channel Setting Sensing Sensitivity: 0.45 mV
Zone Setting Status: 755011

## 2024-02-16 ENCOUNTER — Encounter: Payer: Self-pay | Admitting: Cardiology

## 2024-02-17 ENCOUNTER — Other Ambulatory Visit: Payer: Self-pay | Admitting: Urology

## 2024-02-18 ENCOUNTER — Ambulatory Visit (INDEPENDENT_AMBULATORY_CARE_PROVIDER_SITE_OTHER): Admitting: Gastroenterology

## 2024-02-18 ENCOUNTER — Encounter: Payer: Self-pay | Admitting: Gastroenterology

## 2024-02-18 VITALS — BP 132/77 | HR 74 | Temp 98.4°F | Ht 68.0 in | Wt 198.0 lb

## 2024-02-18 DIAGNOSIS — K219 Gastro-esophageal reflux disease without esophagitis: Secondary | ICD-10-CM | POA: Diagnosis not present

## 2024-02-18 DIAGNOSIS — R197 Diarrhea, unspecified: Secondary | ICD-10-CM

## 2024-02-18 MED ORDER — OMEPRAZOLE 40 MG PO CPDR: 40.0000 mg | DELAYED_RELEASE_CAPSULE | Freq: Every day | ORAL | 0 refills | Status: DC

## 2024-02-18 NOTE — Progress Notes (Signed)
 Wyline Mood MD, MRCP(U.K) [address]  [address]  [place], [place] [address]  Main: [phone]  Fax: [phone]   Gastroenterology Consultation  Referring Provider:     Dana Allan, MD Primary Care Physician:  Dana Allan, MD Primary Gastroenterologist:  Dr. Wyline Mood  Reason for Consultation:     Referred for diarrhea        HPI:   Leyani Gargus is a 68 y.o. y/o female referred for consultation & management  by Dr. Dana Allan, MD.     She has been referred for diarrhea.  01/07/2024 Salmonella Shigella culture negative stool for GI PCR negative stool has not been checked for C. difficile. 06/20/2023: Colonoscopy performed by myself 4 polyps resected that were tubular adenomas.  12/20/2023: Hemoglobin 14.2 CMP normal.   She says that she has been having nausea with no vomiting going on for over 2 months usually wakes her up in the night not associated abdominal pain no vomiting.  Does have history of heartburn not taking anything for the same.  Denies any constipation having 5-6 bowel movements each morning sometimes wakes her up from the night has seen a touch of blood at times.  Denies any NSAID use has been on Repatha for about a year.  Denies any use of artificial sugars or sweeteners.  No unintentional weight loss or weight gain. 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 03/29/2011    Past Surgical History:  Procedure Laterality Date   BREAST EXCISIONAL BIOPSY     CARDIAC DEFIBRILLATOR PLACEMENT     CATARACT EXTRACTION Left    COLONOSCOPY WITH PROPOFOL N/A 06/20/2023   Procedure: COLONOSCOPY WITH PROPOFOL;  Surgeon: Wyline Mood, MD;  Location: [facility] ENDOSCOPY;  Service: Gastroenterology;  Laterality: N/A;   MIDDLE EAR SURGERY Left    POLYPECTOMY  06/20/2023   Procedure: POLYPECTOMY;  Surgeon: Wyline Mood, MD;   Location: [facility] ENDOSCOPY;  Service: Gastroenterology;;   SVT ABLATION N/A 07/18/2022   Procedure: SVT ABLATION;  Surgeon: Lanier Prude, MD;  Location: [facility] INVASIVE CV LAB;  Service: Cardiovascular;  Laterality: N/A;    Prior to Admission medications   Medication Sig Start Date End Date Taking? Authorizing Provider  acetaminophen (TYLENOL) 500 MG tablet Take 500-1,000 mg by mouth every 6 (six) hours as needed (pain.).    [provider]  azelastine (ASTELIN) 0.1 % nasal spray Place 2 sprays into both nostrils in the morning. Use in each nostril as directed    [provider]  B Complex-C (B-COMPLEX WITH VITAMIN C) tablet Take 1 tablet by mouth daily in the afternoon.    [provider]  cetirizine (ZYRTEC) 10 MG tablet Take 10 mg by mouth daily as needed for allergies.    [provider]  estradiol (ESTRACE) 0.1 MG/GM vaginal cream Discard applicator Apply pea sized amount to tip of finger to urethra before bed. Wash hands well after application. Use Monday, Wednesday and Friday 02/07/23   Vanna Scotland, MD  Evolocumab (REPATHA) 140 MG/ML SOSY INJECT THE CONTENTS OF ONE SYRINGE INTO THE SKIN EVERY 14 DAYS. 11/05/23   Dana Allan, MD  MAGNESIUM PO Take by mouth daily. Over the counter take by mouth daily    [provider]  methylPREDNISolone (MEDROL DOSEPAK) 4 MG TBPK tablet Take 4 mg by mouth. 04/04/23  [provider]  metoprolol succinate (TOPROL-XL) 25 MG 24 hr tablet Take 25 mg (1 tablet) in the morning, and 25 (1 tablet) mg in the evening. 08/23/23   Riddle, Suzann, NP  nitrofurantoin, macrocrystal-monohydrate, (MACROBID) 100 MG capsule Take 1 capsule (100 mg total) by mouth 2 (two) times daily. 01/24/24   Reena Canning, NP  ondansetron (ZOFRAN) 4 MG tablet Take 1 tablet (4 mg total) by mouth every 8 (eight) hours as needed for nausea or vomiting. 12/20/23   Valli Gaw, MD  Vitamin D, Ergocalciferol, (DRISDOL) 1.25 MG (50000 UNIT)  CAPS capsule TAKE 1 CAPSULE (50,000 UNITS TOTAL) BY MOUTH EVERY 7 (SEVEN) DAYS 10/03/23   Valli Gaw, MD    Family History  Problem Relation Age of Onset   Hypertension Mother    Hypertension Maternal Aunt    Diabetes Maternal Grandmother    Breast cancer Neg Hx      Social History   Tobacco Use   Smoking status: Never   Smokeless tobacco: Never  Vaping Use   Vaping status: Never Used  Substance Use Topics   Alcohol use: Yes   Drug use: Never    Allergies as of 02/18/2024 - Review Complete 02/18/2024  Allergen Reaction Noted   Codeine Hives, Itching, and Rash 07/13/2004   Morphine and codeine Hives, Itching, and Rash 11/07/2009   Sulfa antibiotics Hives, Itching, and Rash 11/07/2009   Sulfamethoxazole-trimethoprim Rash 04/30/2004    Review of Systems:    All systems reviewed and negative except where noted in HPI.   Physical Exam:  BP 132/77   Pulse 74   Temp 98.4 F (36.9 C)   Ht 5\' 8"  (1.727 m)   Wt 198 lb (89.8 kg)   BMI 30.11 kg/m  No LMP recorded. Patient is postmenopausal. Psych:  Alert and cooperative. Normal mood and affect. General:   Alert,  Well-developed, well-nourished, pleasant and cooperative in NAD Head:  Normocephalic and atraumatic. Eyes:  Sclera clear, no icterus.   Conjunctiva pink. Ears:  Normal auditory acuity.  Neurologic:  Alert and oriented x3;  grossly normal neurologically. Psych:  Alert and cooperative. Normal mood and affect.  Imaging Studies: CUP PACEART REMOTE DEVICE CHECK Result Date: 02/13/2024 Scheduled remote reviewed. Normal device function.  Presenting rhythm: VS. 1 SVT classified episode on 02/11/24, 6 sec, EGM c/w NCT at 143 bpm. Next remote 91 days. MC, CVRS  MM 3D SCREENING MAMMOGRAM BILATERAL BREAST Result Date: 01/30/2024 CLINICAL DATA:  Screening. EXAM: DIGITAL SCREENING BILATERAL MAMMOGRAM WITH TOMOSYNTHESIS AND CAD TECHNIQUE: Bilateral screening digital craniocaudal and mediolateral oblique mammograms were  obtained. Bilateral screening digital breast tomosynthesis was performed. The images were evaluated with computer-aided detection. COMPARISON:  Previous exam(s). ACR Breast Density Category b: There are scattered areas of fibroglandular density. FINDINGS: There are no findings suspicious for malignancy. IMPRESSION: No mammographic evidence of malignancy. A result letter of this screening mammogram will be mailed directly to the patient. RECOMMENDATION: Screening mammogram in one year. (Code:SM-B-01Y) BI-RADS CATEGORY  1: Negative. Electronically Signed   By: Rinda Cheers M.D.   On: 01/30/2024 14:35   DG Bone Density Result Date: 01/29/2024 EXAM: DUAL X-RAY ABSORPTIOMETRY (DXA) FOR BONE MINERAL DENSITY IMPRESSION: Your patient Monette Omara completed a BMD test on 01/29/2024 using the [employer] iDXA DXA System (software version: 14.10) manufactured by [employer]. The following summarizes the results of our evaluation. Technologist: SCE PATIENT BIOGRAPHICAL: Name: Inola, Lisle Patient ID: [id number] Birth Date: 1956/10/02 Height: 67.0 in. Gender: Female Exam Date:  01/29/2024 Weight: 196.3 lbs. Indications: History of Spinal Surgery, Postmenopausal, Vitamin D Deficiency Fractures: Treatments: Vitamin D DENSITOMETRY RESULTS: Site      Region     Measured Date Measured Age WHO Classification Young Adult T-score BMD         %Change vs. Previous Significant Change (*) AP Spine L1-L4 01/29/2026 65.5 Normal 1.9 1.435 g/cm2 - - DualFemur Neck Right 01/29/20[age].5 Osteopenia -1.4 0.847 g/cm2 - - ASSESSMENT: The BMD measured at Femur Neck Right is 0.847 g/cm2 with a T-score of -1.4. This patient is considered osteopenic according to World Health Organization [facility]) criteria. The scan quality is good. World Science writer [facility]) criteria for post-menopausal, Caucasian Women: Normal:                   T-score at or above -1 SD Osteopenia/low bone mass: T-score between -1 and -2.5 SD Osteoporosis:              T-score at or below -2.5 SD RECOMMENDATIONS: 1. All patients should optimize calcium and vitamin D intake. 2. Consider FDA-approved medical therapies in postmenopausal women and men aged 66 years and older, based on the following: a. A hip or vertebral(clinical or morphometric) fracture b. T-score < -2.5 at the femoral neck or spine after appropriate evaluation to exclude secondary causes c. Low bone mass (T-score between -1.0 and -2.5 at the femoral neck or spine) and a 10-year probability of a hip fracture > 3% or a 10-year probability of a major osteoporosis-related fracture > 20% based on the [place]-adapted WHO algorithm 3. Clinician judgment and/or patient preferences may indicate treatment for people with 10-year fracture probabilities above or below these levels FOLLOW-UP: People with diagnosed cases of osteoporosis or at high risk for fracture should have regular bone mineral density tests. For patients eligible for Medicare, routine testing is allowed once every 2 years. The testing frequency can be increased to one year for patients who have rapidly progressing disease, those who are receiving or discontinuing medical therapy to restore bone mass, or have additional risk factors. I have reviewed this report, and agree with the above findings. [facility] Dear Dana Allan, Your patient Willona Phariss completed a FRAX assessment on 01/29/2024 using the [facility] iDXA DXA System (analysis version: 14.10) manufactured by [employer]. The following summarizes the results of our evaluation. PATIENT BIOGRAPHICAL: Name: Parrish, Daddario Patient ID: [id number] Birth Date: 09/30/1956 Height:    67.0 in. Gender:     Female    [age]       Weight:    196.3 lbs. Ethnicity:  Black                            Exam Date: 01/29/2024 FRAX* RESULTS:  (version: 3.5) 10-year Probability of Fracture1 Major Osteoporotic Fracture2 Hip Fracture 3.7% 0.4% Population: [place] (Black) Risk Factors: None Based on Femur (Right)  Neck BMD 1 -The 10-year probability of fracture may be lower than reported if the patient has received treatment. 2 -Major Osteoporotic Fracture: Clinical Spine, Forearm, Hip or Shoulder *FRAX is a Armed forces logistics/support/administrative officer of the [employer] of [employer] for Metabolic Bone Disease, a World Science writer (WHO) [employer]. ASSESSMENT: The probability of a major osteoporotic fracture is 3.7% within the next ten years. The probability of a hip fracture is 0.4% within the next ten years. Electronically Signed   By: Frederico Hamman M.D.  On: 01/29/2024 12:49   CUP PACEART INCLINIC DEVICE CHECK Result Date: 01/28/2024 Normal in-clinic _dual__ chamber ICD check. Presenting Rhythm: _AS-VS__ . Routine testing was performed. Thresholds, sensing, and impedance demonstrate stable parameters and no programming changes needed. No treated arrhythmias. Estimated longevity __11__ . Pt enrolled in remote follow-up. Maylene Spear, NP   Assessment and Plan:   Lamona Eimer is a 68 y.o. y/o female has been referred for diarrhea.  The patient is on Repatha and one of the side effects with diarrhea.  The early morning nausea is very likely due to acid reflux.  Plan 1.  Celiac serology, fecal calprotectin.  Stool studies.  If fecal calprotectin is elevated will need a colonoscopy to rule out microscopic colitis if negative would be suggestive of IBS-like etiology.  Avoid all artificial sugars and sweeteners.  If fecal calprotectin is not elevated could consider a trial of 6 to 8 weeks and we can hold the Repatha if possible to see if her diarrhea improves.  If it does improve then this would be a drug-induced diarrhea we could consider putting her on medication such as cholestyramine or Imodium and restarting the Repatha if it is needed strongly from the cardiac standpoint.  2.  Acid reflux trial of Prilosec 40 mg once a day suggest lifestyle changes including keeping the head of the bed elevated, avoid  eating for 2 to 3 hours before bedtime, having small meals more often rather than large meals at bedtime.  Avoid weight loss and use a wedge pillow patient information provided.   Follow up in 2 to 3 weeks video visit  Dr Luke Salaam MD,MRCP(U.K)

## 2024-02-18 NOTE — Patient Instructions (Addendum)
 Please go to a [facility] location with your lab orders. Please do the labs that have been ordered for you so Dr. Antony Baumgartner could do a virtual visit with you to go over results. Thank you.   GERD in Adults: Diet Changes When you have gastroesophageal reflux disease (GERD), you may need to make changes to your diet. Choosing the right foods can help with your symptoms. Think about working with an expert in healthy eating called a dietitian. They can help you make healthy food choices. What are tips for following this plan? Reading food labels Look for foods that are low in saturated fat. Foods that may help with your symptoms include: Foods with less than 5% of daily value (DV) of fat. Foods with 0 grams of trans fat. Cooking [employer] in ways that don't use a lot of fat. These ways include: Baking. Steaming. Grilling. Broiling. To add flavor, try to use herbs that are low in spice and acidity. Avoid frying your food. Meal planning  Eat small meals often rather than eating 3 large meals each day. Eat your meals slowly in a place where you feel relaxed. If told by your health care provider, avoid: Foods that cause symptoms. Keep a food diary to keep track of foods that cause symptoms. Alcohol. Drinking a lot of liquid with meals. General instructions For 2-3 hours after you eat, avoid: Bending over. Exercise. Lying down. Chew sugar-free gum after meals. What foods should I eat? Eat a healthy diet. Try to include: Foods with high amounts of fiber. These include: Fruits and vegetables. Whole grains and beans. Low-fat dairy products. Lean meats, fish, and poultry. Egg whites. Foods that cause symptoms in someone else may not cause symptoms for you. Work with your provider to find foods that are safe for you. The items listed above may not be all the foods and drinks you can have. Talk with a dietitian to learn more. The items listed above may not be a complete list of foods and  beverages you can eat and drink. Contact a dietitian for more information. What foods should I avoid? Limiting some of these foods may help with your symptoms. Each person is different. Talk with a dietitian or your provider to help you find the exact foods to avoid. Some of the foods to avoid may include: Fruits Fruits with a lot of acid in them. These may include citrus fruits, such as oranges, grapefruit, pineapple, and lemons. Vegetables Deep-fried vegetables, such as [place] fries. Vegetables, sauces, or toppings made with added fat and vegetables with acid in them. These may include tomatoes and tomato products, chili peppers, onions, garlic, and horseradish. Grains Pastries or quick breads with added fat. Meats and other proteins High-fat meats, such as fatty beef or pork, hot dogs, ribs, ham, sausage, salami, and bacon. Fried meat or protein, such as fried fish and fried chicken. Egg yolks. Fats and oils Butter. Margarine. Shortening. Ghee. Drinks Coffee and other drinks with caffeine in them. Fizzy and sugary drinks, such as soda and energy drinks. Fruit juice made with acidic fruits, such as orange or grapefruit. Tomato juice. Sweets and desserts Chocolate and cocoa. Donuts. Seasonings and condiments Mint, such as peppermint and spearmint. Condiments, herbs, or seasonings that cause symptoms. These may include curry, hot sauce, or vinegar-based salad dressings. The items listed above may not be all the foods and drinks you should avoid. Talk with a dietitian to learn more. Questions to ask your health care provider Changes to  your diet and everyday life are often the first steps taken to manage symptoms of GERD. If these changes don't help, talk with your provider about taking medicines. Where to find more information International Foundation for Gastrointestinal Disorders: aboutgerd.org This information is not intended to replace advice given to you by your health care provider.  Make sure you discuss any questions you have with your health care provider. Document Revised: 09/04/2023 Document Reviewed: 03/21/2023 Elsevier Patient Education  2024 [employer].

## 2024-02-24 LAB — GI PROFILE, STOOL, PCR

## 2024-02-24 LAB — C-REACTIVE PROTEIN: CRP: 2 mg/L (ref 0–10)

## 2024-02-24 LAB — CELIAC DISEASE AB SCREEN W/RFX
Antigliadin Abs, IgA: 4 U (ref 0–19)
IgA/Immunoglobulin A, Serum: 124 mg/dL (ref 87–352)

## 2024-02-24 LAB — CALPROTECTIN, FECAL: Calprotectin, Fecal: 671 ug/g — ABNORMAL HIGH (ref 0–120)

## 2024-02-25 ENCOUNTER — Telehealth: Payer: Self-pay

## 2024-02-25 LAB — C DIFFICILE, CYTOTOXIN B

## 2024-02-25 LAB — C DIFFICILE TOXINS A+B W/RFLX: C difficile Toxins A+B, EIA: NEGATIVE

## 2024-02-25 MED ORDER — FIDAXOMICIN 200 MG PO TABS: 200.0000 mg | ORAL_TABLET | Freq: Two times a day (BID) | ORAL | 0 refills | Status: DC

## 2024-02-25 MED ORDER — VANCOMYCIN HCL 125 MG PO CAPS: 125.0000 mg | ORAL_CAPSULE | Freq: Four times a day (QID) | ORAL | 0 refills | Status: AC

## 2024-02-25 NOTE — Telephone Encounter (Signed)
-----   Message from Luke Salaam sent at 02/24/2024  8:08 PM EDT ----- Stool is positive for c diff   Start on either vancomycin  or dificid    Cancel any colonoscopy till she completes the Rx to see if she gets better- Virtual follow up after completing rx

## 2024-02-25 NOTE — Telephone Encounter (Signed)
 Called patient and left her a detailed message letting her know that her lab result was c-diff positive and therefore, she is to start taking an antibiotic (Dificid  200MG  twice a day for 10 days).

## 2024-02-25 NOTE — Addendum Note (Signed)
 Addended by: Hershell Lose on: 02/25/2024 11:50 AM   Modules accepted: Orders

## 2024-03-06 ENCOUNTER — Telehealth: Admitting: Gastroenterology

## 2024-03-06 DIAGNOSIS — Z09 Encounter for follow-up examination after completed treatment for conditions other than malignant neoplasm: Secondary | ICD-10-CM

## 2024-03-06 DIAGNOSIS — R197 Diarrhea, unspecified: Secondary | ICD-10-CM

## 2024-03-06 DIAGNOSIS — Z8619 Personal history of other infectious and parasitic diseases: Secondary | ICD-10-CM

## 2024-03-06 NOTE — Progress Notes (Signed)
 Primary Care Physician: Valli Gaw, MD  Virtual Visit via Video Note  I connected with patient on 03/06/24 at 10:00 AM EDT by video and verified that I am speaking with the correct person using two identifiers.   I discussed the limitations, risks, security and privacy concerns of performing an evaluation and management service by video  and the availability of in person appointments. I also discussed with the patient that there may be a patient responsible charge related to this service. The patient expressed understanding and agreed to proceed.  Location of Patient: Home Location of Provider: Home Persons involved: Patient and provider only   History of Present Illness: No chief complaint on file.   HPI: Christine Fitzpatrick is a 68 y.o. female   Summary of history :   Initially referred and seen on 02/18/2024 for diarrhea.She says that she has been having nausea with no vomiting going on for over 2 months usually wakes her up in the night not associated abdominal pain no vomiting. Does have history of heartburn not taking anything for the same. Denies any constipation having 5-6 bowel movements each morning sometimes wakes her up from the night has seen a touch of blood at times. Denies any NSAID use has been on Repatha  for about a year. Denies any use of artificial sugars or sweeteners. No unintentional weight loss or weight gain.    Stool cultures for Salmonella Shigella in March 2025 were negative.  Colonoscopy performed in May 2020 for showed 4 tubular adenomas.  Hemoglobin and CMP were normal in 2025.   Interval history 02/18/2024-03/06/2024  02/21/2024 Fecal calprotectin elevated at 6 fecal calprotectin elevated at 671, stool for C. difficile was negative stool for GI PCR was positive for C. difficile toxin celiac serology was negative CRP was 2 On 02/25/2024 she was commenced on Dificid  for 10 days.  Since commencing on the Dificid  her diarrhea has resolved no symptoms presently  she has a few more days of medication left she restarted her Repatha  last week. Current Outpatient Medications  Medication Sig Dispense Refill   acetaminophen  (TYLENOL ) 500 MG tablet Take 500-1,000 mg by mouth every 6 (six) hours as needed (pain.).     azelastine (ASTELIN) 0.1 % nasal spray Place 2 sprays into both nostrils in the morning. Use in each nostril as directed     B Complex-C (B-COMPLEX WITH VITAMIN C) tablet Take 1 tablet by mouth daily in the afternoon.     cetirizine (ZYRTEC) 10 MG tablet Take 10 mg by mouth daily as needed for allergies.     estradiol  (ESTRACE ) 0.1 MG/GM vaginal cream Discard applicator Apply pea sized amount to tip of finger to urethra before bed. Wash hands well after application. Use Monday, Wednesday and Friday 42.5 g 12   Evolocumab  (REPATHA ) 140 MG/ML SOSY INJECT THE CONTENTS OF ONE SYRINGE INTO THE SKIN EVERY 14 DAYS. 6 mL 1   MAGNESIUM PO Take by mouth daily. Over the counter take by mouth daily     methylPREDNISolone (MEDROL DOSEPAK) 4 MG TBPK tablet Take 4 mg by mouth.     metoprolol  succinate (TOPROL -XL) 25 MG 24 hr tablet Take 25 mg (1 tablet) in the morning, and 25 (1 tablet) mg in the evening. 360 tablet 2   nitrofurantoin , macrocrystal-monohydrate, (MACROBID ) 100 MG capsule Take 1 capsule (100 mg total) by mouth 2 (two) times daily. 10 capsule 0   omeprazole  (PRILOSEC) 40 MG capsule Take 1 capsule (40 mg total) by mouth daily. 90  capsule 0   ondansetron  (ZOFRAN ) 4 MG tablet Take 1 tablet (4 mg total) by mouth every 8 (eight) hours as needed for nausea or vomiting. 20 tablet 0   vancomycin  (VANCOCIN ) 125 MG capsule Take 1 capsule (125 mg total) by mouth 4 (four) times daily for 14 days. 56 capsule 0   Vitamin D , Ergocalciferol , (DRISDOL ) 1.25 MG (50000 UNIT) CAPS capsule TAKE 1 CAPSULE (50,000 UNITS TOTAL) BY MOUTH EVERY 7 (SEVEN) DAYS 12 capsule 1   No current facility-administered medications for this visit.    Allergies as of 03/06/2024 - Review  Complete 02/18/2024  Allergen Reaction Noted   Codeine Hives, Itching, and Rash 07/13/2004   Morphine and codeine Hives, Itching, and Rash 11/07/2009   Sulfa antibiotics Hives, Itching, and Rash 11/07/2009   Sulfamethoxazole-trimethoprim Rash 04/30/2004    Review of Systems:    All systems reviewed and negative except where noted in HPI.  General Appearance:    Alert, cooperative, no distress, appears stated age  Head:    Normocephalic, without obvious abnormality, atraumatic  Eyes:    PERRL, conjunctiva/corneas clear,  Ears:    Grossly normal hearing    Neurologic:  Grossly normal    Observations/Objective:  Labs: CMP     Component Value Date/Time   NA 142 12/20/2023 0941   NA 143 06/15/2023 1108   K 3.9 12/20/2023 0941   CL 105 12/20/2023 0941   CO2 28 12/20/2023 0941   GLUCOSE 98 12/20/2023 0941   BUN 13 12/20/2023 0941   BUN 10 06/15/2023 1108   CREATININE 0.91 12/20/2023 0941   CREATININE 0.89 11/23/2021 0908   CALCIUM  9.6 12/20/2023 0941   PROT 7.5 12/20/2023 0941   ALBUMIN 4.4 12/20/2023 0941   AST 19 12/20/2023 0941   ALT 19 12/20/2023 0941   ALKPHOS 84 12/20/2023 0941   BILITOT 0.5 12/20/2023 0941   GFRNONAA >60 07/05/2022 0907   Lab Results  Component Value Date   WBC 6.5 12/20/2023   HGB 14.2 12/20/2023   HCT 43.6 12/20/2023   MCV 97.6 12/20/2023   PLT 303.0 12/20/2023    Imaging Studies: CUP PACEART REMOTE DEVICE CHECK Result Date: 02/13/2024 Scheduled remote reviewed. Normal device function.  Presenting rhythm: VS. 1 SVT classified episode on 02/11/24, 6 sec, EGM c/w NCT at 143 bpm. Next remote 91 days. MC, CVRS   Assessment and Plan:   Christine Fitzpatrick is a 68 y.o. y/o female  for diarrhea.  Since her last visit stool test showed that her C. difficile is positive commenced on Dificid  receive treatment for 10 days.  Fecal calprotectin was elevated at 671.  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  C. difficile infection.  Presently has no  symptoms diarrhea has resolved  plan 1.   Advised to call me back if the diarrhea recurs.  She has restarted her Repatha .  My suspicion is that the C. difficile was more likely the cause of her diarrhea than the Repatha  since fecal calprotectin was elevated and it was an inflammatory diarrhea but if her diarrhea recurs we will need to reevaluate.  Follow-up as needed I discussed the assessment and treatment plan with the patient. The patient was provided an opportunity to ask questions and all were answered. The patient agreed with the plan and demonstrated an understanding of the instructions.   The patient was advised to call back or seek an in-person evaluation if the symptoms worsen or if the condition fails to improve as anticipated.  I  provided 15 minutes of face-to-face time during this encounter.  Dr Luke Salaam MD,MRCP [facility]) Gastroenterology/Hepatology Pager: [phone]   Speech recognition software was used to dictate this note.

## 2024-03-07 ENCOUNTER — Ambulatory Visit (INDEPENDENT_AMBULATORY_CARE_PROVIDER_SITE_OTHER): Admitting: Family Medicine

## 2024-03-07 ENCOUNTER — Encounter: Payer: Self-pay | Admitting: Family Medicine

## 2024-03-07 VITALS — BP 120/68 | HR 77 | Temp 98.3°F | Resp 20 | Ht 68.0 in | Wt 196.5 lb

## 2024-03-07 DIAGNOSIS — Z0000 Encounter for general adult medical examination without abnormal findings: Secondary | ICD-10-CM | POA: Diagnosis not present

## 2024-03-07 MED ORDER — ESTRADIOL 0.1 MG/GM VA CREA: TOPICAL_CREAM | VAGINAL | 12 refills | Status: DC

## 2024-03-07 NOTE — Progress Notes (Unsigned)
 Subjective:    Christine Fitzpatrick is a 68 y.o. female who presents for a Welcome to Medicare exam.   Cardiac Risk Factors include: none      Objective:    Today's Vitals   03/07/24 0816  BP: 120/68  Pulse: 77  Resp: 20  Temp: 98.3 F (36.8 C)  SpO2: 99%  Weight: 196 lb 8 oz (89.1 kg)  Height: 5\' 8"  (1.727 m)  Body mass index is 29.88 kg/m.  Medications Outpatient Encounter Medications as of 03/07/2024  Medication Sig   acetaminophen  (TYLENOL ) 500 MG tablet Take 500-1,000 mg by mouth every 6 (six) hours as needed (pain.).   azelastine (ASTELIN) 0.1 % nasal spray Place 2 sprays into both nostrils in the morning. Use in each nostril as directed   B Complex-C (B-COMPLEX WITH VITAMIN C) tablet Take 1 tablet by mouth daily in the afternoon.   cetirizine (ZYRTEC) 10 MG tablet Take 10 mg by mouth daily as needed for allergies.   estradiol  (ESTRACE ) 0.1 MG/GM vaginal cream Discard applicator Apply pea sized amount to tip of finger to urethra before bed. Wash hands well after application. Use Monday, Wednesday and Friday   Evolocumab  (REPATHA ) 140 MG/ML SOSY INJECT THE CONTENTS OF ONE SYRINGE INTO THE SKIN EVERY 14 DAYS.   MAGNESIUM PO Take by mouth daily. Over the counter take by mouth daily   methylPREDNISolone (MEDROL DOSEPAK) 4 MG TBPK tablet Take 4 mg by mouth.   metoprolol  succinate (TOPROL -XL) 25 MG 24 hr tablet Take 25 mg (1 tablet) in the morning, and 25 (1 tablet) mg in the evening.   omeprazole  (PRILOSEC) 40 MG capsule Take 1 capsule (40 mg total) by mouth daily.   ondansetron  (ZOFRAN ) 4 MG tablet Take 1 tablet (4 mg total) by mouth every 8 (eight) hours as needed for nausea or vomiting.   vancomycin  (VANCOCIN ) 125 MG capsule Take 1 capsule (125 mg total) by mouth 4 (four) times daily for 14 days.   Vitamin D , Ergocalciferol , (DRISDOL ) 1.25 MG (50000 UNIT) CAPS capsule TAKE 1 CAPSULE (50,000 UNITS TOTAL) BY MOUTH EVERY 7 (SEVEN) DAYS   [DISCONTINUED] nitrofurantoin ,  macrocrystal-monohydrate, (MACROBID ) 100 MG capsule Take 1 capsule (100 mg total) by mouth 2 (two) times daily.   No facility-administered encounter medications on file as of 03/07/2024.     History: 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 03/29/2011   Past Surgical History:  Procedure Laterality Date   BREAST EXCISIONAL BIOPSY     CARDIAC DEFIBRILLATOR PLACEMENT     CATARACT EXTRACTION Left    COLONOSCOPY WITH PROPOFOL  N/A 06/20/2023   Procedure: COLONOSCOPY WITH PROPOFOL ;  Surgeon: Luke Salaam, MD;  Location: [facility] ENDOSCOPY;  Service: Gastroenterology;  Laterality: N/A;   MIDDLE EAR SURGERY Left    POLYPECTOMY  06/20/2023   Procedure: POLYPECTOMY;  Surgeon: Luke Salaam, MD;  Location: [facility] ENDOSCOPY;  Service: Gastroenterology;;   SVT ABLATION N/A 07/18/2022   Procedure: SVT ABLATION;  Surgeon: Boyce Byes, MD;  Location: [facility] INVASIVE CV LAB;  Service: Cardiovascular;  Laterality: N/A;    Family History  Problem Relation Age of Onset   Hypertension Mother    Hypertension Maternal Aunt    Diabetes Maternal Grandmother    Breast cancer Neg Hx    Social History   Occupational History   Not on file  Tobacco  Use   Smoking status: Never   Smokeless tobacco: Never  Vaping Use   Vaping status: Never Used  Substance and Sexual Activity   Alcohol use: Not Currently   Drug use: Never   Sexual activity: Yes    Tobacco Counseling Counseling given: Yes   Immunizations and Health Maintenance Immunization History  Administered Date(s) Administered   Influenza Split 08/18/2008   Influenza, Seasonal, Injecte, Preservative Fre 08/20/2009, 08/12/2010   Influenza,inj,Quad PF,6+ Mos 08/05/2015, 08/09/2018, 07/04/2019, 08/10/2020   Influenza-Unspecified 08/10/2016, 08/21/2017, 08/06/2022, 07/04/2023   Janssen (J&J)  SARS-COV-2 Vaccination 01/12/2020   Moderna Sars-Covid-2 Vaccination 09/08/2020, 02/07/2021   PNEUMOCOCCAL CONJUGATE-20 12/20/2023   PPD Test 05/15/2005, 04/24/2006, 06/04/2007, 06/17/2008, 06/21/2009, 06/13/2010, 05/20/2021, 12/16/2022   Pfizer Covid-19 Vaccine Bivalent Booster 5y-11y 08/06/2022   Td (Adult),5 Lf Tetanus Toxid, Preservative Free 06/20/2005, 01/09/2019   Tdap 06/20/2005, 12/08/2008, 01/09/2019   Zoster Recombinant(Shingrix) 06/02/2020, 12/01/2020   Health Maintenance Due  Topic Date Due   COVID-19 Vaccine (7 - 2024-25 season) 01/04/2024    Activities of Daily Living    03/07/2024    8:21 AM 03/05/2024    7:15 PM  In your present state of health, do you have any difficulty performing the following activities:  Hearing? 0 0  Vision? 0 0  Difficulty concentrating or making decisions? 0 0  Walking or climbing stairs? 0 0  Dressing or bathing? 0 0  Doing errands, shopping? 0 0  Preparing Food and eating ? N N  Using the Toilet? N N  In the past six months, have you accidently leaked urine? N N  Do you have problems with loss of bowel control? N N  Managing your Medications? N N  Managing your Finances? N N  Housekeeping or managing your Housekeeping? N N    Physical Exam   Physical Exam Vitals reviewed.  Constitutional:      Appearance: Normal appearance.  Cardiovascular:     Rate and Rhythm: Normal rate.  Pulmonary:     Effort: Pulmonary effort is normal.  Neurological:     Mental Status: She is alert.    (optional), or other factors deemed appropriate based on the beneficiary's medical and social history and current clinical standards.   Advanced Directives:    EKG:  unchanged from previous tracings      Assessment:    This is a routine wellness examination for this patient .   Vision/Hearing screen Vision Screening   Right eye Left eye Both eyes  Without correction 20/20 20/200 20/25  With correction     Hearing Screening - Comments::  Hearing Passes   Goals   None    Depression Screen    03/07/2024    8:14 AM 12/20/2023    8:46 AM 06/07/2022   10:16 AM 06/06/2021    9:07 AM  PHQ 2/9 Scores  PHQ - 2 Score 0 0 0 0  PHQ- 9 Score  0       Fall Risk    03/07/2024    8:24 AM  Fall Risk   Falls in the past year? 0  Number falls in past yr: 0  Injury with Fall? 0  Risk for fall due to : No Fall Risks  Follow up Falls evaluation completed    Cognitive Function:        Patient Care Team: Valli Gaw, MD as PCP - General (Family Medicine) Boyce Byes, MD as PCP - Electrophysiology (Cardiology)     Plan:    I  have personally reviewed and noted the following in the patient's chart:   Medical and social history Use of alcohol, tobacco or illicit drugs  Current medications and supplements including opioid prescriptions. Patient is not currently taking opioid prescriptions. Functional ability and status Nutritional status Physical activity Advanced directives List of other physicians Hospitalizations, surgeries, and ER visits in previous 12 months Vitals Screenings to include cognitive, depression, and falls Referrals and appointments  In addition, I have reviewed and discussed with patient certain preventive protocols, quality metrics, and best practice recommendations. A written personalized care plan for preventive services as well as general preventive health recommendations were provided to patient.     Valli Gaw, MD 03/07/2024

## 2024-03-07 NOTE — Patient Instructions (Addendum)
 It was a pleasure meeting you today. Thank you for allowing me to take part in your health care.  Our goals for today as we discussed include:  Follow up with GI as scheduled Continue Vancomycin  until completed  Follow up with Cardiology as scheduled If do not tolerate Repatha  suggest discussion with Dr Marven Slimmer for other options   Refill sent for requested medication  This is a list of the screening recommended for you and due dates:  Health Maintenance  Topic Date Due   COVID-19 Vaccine (7 - 2024-25 season) 01/04/2024   Flu Shot  06/06/2024   Mammogram  01/28/2025   Medicare Annual Wellness Visit  03/07/2025   Colon Cancer Screening  06/19/2026   Pap with HPV screening  04/04/2028   DTaP/Tdap/Td vaccine (6 - Td or Tdap) 01/08/2029   Pneumonia Vaccine  Completed   DEXA scan (bone density measurement)  Completed   Hepatitis C Screening  Completed   HIV Screening  Completed   Zoster (Shingles) Vaccine  Completed   HPV Vaccine  Aged Out   Meningitis B Vaccine  Aged Out      If you have any questions or concerns, please do not hesitate to call the office at [phone].  I look forward to our next visit and until then take care and stay safe.  Regards,   Valli Gaw, MD   [facility]

## 2024-03-09 ENCOUNTER — Encounter: Payer: Self-pay | Admitting: Family Medicine

## 2024-03-16 ENCOUNTER — Encounter: Payer: Self-pay | Admitting: Family Medicine

## 2024-03-17 NOTE — Telephone Encounter (Signed)
 Spoke with pt and schedule her for sooner appt this week with Dr. Sueanne Emerald.

## 2024-03-19 ENCOUNTER — Other Ambulatory Visit (HOSPITAL_COMMUNITY)
Admission: RE | Admit: 2024-03-19 | Discharge: 2024-03-19 | Disposition: A | Discharge status: Home / Self Care | Source: Ambulatory Visit | Attending: Family Medicine | Admitting: Family Medicine

## 2024-03-19 ENCOUNTER — Encounter: Payer: Self-pay | Admitting: Family Medicine

## 2024-03-19 ENCOUNTER — Ambulatory Visit (INDEPENDENT_AMBULATORY_CARE_PROVIDER_SITE_OTHER): Admitting: Family Medicine

## 2024-03-19 VITALS — BP 118/78 | HR 83 | Temp 98.2°F | Resp 20 | Ht 68.0 in | Wt 196.5 lb

## 2024-03-19 DIAGNOSIS — B9689 Other specified bacterial agents as the cause of diseases classified elsewhere: Secondary | ICD-10-CM

## 2024-03-19 DIAGNOSIS — R309 Painful micturition, unspecified: Secondary | ICD-10-CM | POA: Diagnosis present

## 2024-03-19 DIAGNOSIS — N760 Acute vaginitis: Secondary | ICD-10-CM

## 2024-03-19 LAB — POC URINALSYSI DIPSTICK (AUTOMATED)
Bilirubin, UA: NEGATIVE
Blood, UA: NEGATIVE
Glucose, UA: NEGATIVE
Leukocytes, UA: NEGATIVE
Nitrite, UA: NEGATIVE
Protein, UA: NEGATIVE
Spec Grav, UA: 1.02 (ref 1.010–1.025)
Urobilinogen, UA: 0.2 U/dL
pH, UA: 5.5 (ref 5.0–8.0)

## 2024-03-19 NOTE — Progress Notes (Signed)
 SUBJECTIVE:   Chief Complaint  Patient presents with   Dysuria    X 2-3 weeks   HPI Presents for acute visit  Discussed the use of AI scribe software for clinical note transcription with the patient, who gave verbal consent to proceed.  History of Present Illness Christine Fitzpatrick is a 68 year old female who presents with urinary discomfort and frequency following recent antibiotic treatment for C. difficile infection.  She has been experiencing urinary discomfort and frequency since the onset of her C. difficile infection a couple of months ago. She completed a course of Vancomycin  for the infection last week, but the discomfort has persisted. She describes discomfort when bearing down to urinate and occasional pubic pain. No vaginal discharge, blood in urine, fever, or itching. Urine is clear.  She drinks about sixty ounces of fluids daily and has been trying to increase her fluid intake. She uses a protein drink containing probiotics, which she sometimes consumes for breakfast.  She has chronic back pain, which has not changed recently.     PERTINENT PMH / PSH: As above  OBJECTIVE:  BP 118/78   Pulse 83   Temp 98.2 F (36.8 C)   Resp 20   Ht 5\' 8"  (1.727 m)   Wt 196 lb 8 oz (89.1 kg)   SpO2 97%   BMI 29.88 kg/m    Physical Exam Vitals reviewed.  Constitutional:      General: She is not in acute distress.    Appearance: Normal appearance. She is normal weight. She is not ill-appearing, toxic-appearing or diaphoretic.  Eyes:     General:        Right eye: No discharge.        Left eye: No discharge.     Conjunctiva/sclera: Conjunctivae normal.  Cardiovascular:     Rate and Rhythm: Normal rate.  Pulmonary:     Effort: Pulmonary effort is normal.  Abdominal:     Tenderness: There is no abdominal tenderness. There is no right CVA tenderness or left CVA tenderness.  Musculoskeletal:        General: Normal range of motion.  Skin:    General: Skin is warm and dry.   Neurological:     General: No focal deficit present.     Mental Status: She is alert and oriented to person, place, and time. Mental status is at baseline.  Psychiatric:        Mood and Affect: Mood normal.        Behavior: Behavior normal.        Thought Content: Thought content normal.        Judgment: Judgment normal.           03/07/2024    8:37 AM 03/07/2024    8:14 AM 12/20/2023    8:46 AM 06/07/2022   10:16 AM 06/06/2021    9:07 AM  Depression screen PHQ 2/9  Decreased Interest 0 0 0 0 0  Down, Depressed, Hopeless 0 0 0 0 0  PHQ - 2 Score 0 0 0 0 0  Altered sleeping 1  0    Tired, decreased energy 0  0    Change in appetite 1  0    Feeling bad or failure about yourself  0  0    Trouble concentrating 0  0    Moving slowly or fidgety/restless 0  0    Suicidal thoughts 0  0    PHQ-9 Score 2  0  Difficult doing work/chores Not difficult at all  Not difficult at all        03/07/2024    8:37 AM 12/20/2023    8:47 AM  GAD 7 : Generalized Anxiety Score  Nervous, Anxious, on Edge 0 0  Control/stop worrying 0 0  Worry too much - different things 0 0  Trouble relaxing 0 0  Restless 0 0  Easily annoyed or irritable 0 0  Afraid - awful might happen 0 0  Total GAD 7 Score 0 0  Anxiety Difficulty Not difficult at all Not difficult at all    ASSESSMENT/PLAN:  Painful urination Assessment & Plan: Urinary discomfort and frequency with clear urine, presence of ketones, no blood, nitrates, or leukocytes. Differential includes yeast infection or early UTI. Recent antibiotics for C. difficile may contribute. Avoiding further antibiotics unless necessary. Recently completed antibiotics for C Diff - Provide self-swab for yeast infection testing. - Send urine sample for analysis. - Vaginal swab for candida - Recommend over-the-counter phenazopyridine for symptom relief. - Advise increasing fluid intake, including cranberry juice. - Consider antibiotics if symptoms worsen or if test  results indicate a UTI.  Orders: -     POCT Urinalysis Dipstick (Automated) -     Urine Culture -     Cervicovaginal ancillary only -     Comprehensive metabolic panel with GFR  Bacterial vaginosis -     metroNIDAZOLE; Take 1 tablet (500 mg total) by mouth 2 (two) times daily for 7 days.  Dispense: 14 tablet; Refill: 0    PDMP reviewed  Return if symptoms worsen or fail to improve, for PCP.  Valli Gaw, MD

## 2024-03-19 NOTE — Patient Instructions (Addendum)
 It was a pleasure meeting you today. Thank you for allowing me to take part in your health care.  Our goals for today as we discussed include:  Urine positive for ketones Will send for further evaluation  Will notify of swabs when resulted  Can take over the counter Azo for discomfort Increase water intake Cranberry juice or tablets daily  Will check kidney function today   This is a list of the screening recommended for you and due dates:  Health Maintenance  Topic Date Due   COVID-19 Vaccine (7 - 2024-25 season) 01/04/2024   Flu Shot  06/06/2024   Mammogram  01/28/2025   Medicare Annual Wellness Visit  03/07/2025   Colon Cancer Screening  06/19/2026   Pap with HPV screening  04/04/2028   DTaP/Tdap/Td vaccine (6 - Td or Tdap) 01/08/2029   Pneumonia Vaccine  Completed   DEXA scan (bone density measurement)  Completed   Hepatitis C Screening  Completed   HIV Screening  Completed   Zoster (Shingles) Vaccine  Completed   HPV Vaccine  Aged Out   Meningitis B Vaccine  Aged Out    If you have any questions or concerns, please do not hesitate to call the office at [phone].  I look forward to our next visit and until then take care and stay safe.  Regards,   Valli Gaw, MD   [facility]

## 2024-03-20 ENCOUNTER — Other Ambulatory Visit: Payer: Self-pay | Admitting: Family Medicine

## 2024-03-20 DIAGNOSIS — R7989 Other specified abnormal findings of blood chemistry: Secondary | ICD-10-CM

## 2024-03-20 LAB — URINE CULTURE
MICRO NUMBER:: 16454792
Result:: NO GROWTH
SPECIMEN QUALITY:: ADEQUATE

## 2024-03-20 LAB — COMPREHENSIVE METABOLIC PANEL WITH GFR
ALT: 19 U/L (ref 0–35)
AST: 14 U/L (ref 0–37)
Albumin: 4.6 g/dL (ref 3.5–5.2)
Alkaline Phosphatase: 90 U/L (ref 39–117)
BUN: 14 mg/dL (ref 6–23)
CO2: 30 meq/L (ref 19–32)
Calcium: 10 mg/dL (ref 8.4–10.5)
Chloride: 102 meq/L (ref 96–112)
Creatinine, Ser: 0.98 mg/dL (ref 0.40–1.20)
GFR: 60.49 mL/min (ref >60.00)
Glucose, Bld: 80 mg/dL (ref 70–99)
Potassium: 3.9 meq/L (ref 3.5–5.1)
Sodium: 142 meq/L (ref 135–145)
Total Bilirubin: 0.3 mg/dL (ref 0.2–1.2)
Total Protein: 7.2 g/dL (ref 6.0–8.3)

## 2024-03-21 ENCOUNTER — Ambulatory Visit: Payer: Self-pay | Admitting: Family Medicine

## 2024-03-21 ENCOUNTER — Encounter: Payer: Self-pay | Admitting: Family Medicine

## 2024-03-21 LAB — CERVICOVAGINAL ANCILLARY ONLY
Bacterial Vaginitis (gardnerella): POSITIVE — AB
Candida Glabrata: NEGATIVE
Candida Vaginitis: NEGATIVE
Comment: NEGATIVE
Comment: NEGATIVE
Comment: NEGATIVE

## 2024-03-23 ENCOUNTER — Encounter: Payer: Self-pay | Admitting: Family Medicine

## 2024-03-23 DIAGNOSIS — R309 Painful micturition, unspecified: Secondary | ICD-10-CM | POA: Insufficient documentation

## 2024-03-23 MED ORDER — METRONIDAZOLE 500 MG PO TABS: 500.0000 mg | ORAL_TABLET | Freq: Two times a day (BID) | ORAL | 0 refills | Status: AC

## 2024-03-23 NOTE — Assessment & Plan Note (Signed)
 Urinary discomfort and frequency with clear urine, presence of ketones, no blood, nitrates, or leukocytes. Differential includes yeast infection or early UTI. Recent antibiotics for C. difficile may contribute. Avoiding further antibiotics unless necessary. Recently completed antibiotics for C Diff - Provide self-swab for yeast infection testing. - Send urine sample for analysis. - Vaginal swab for candida - Recommend over-the-counter phenazopyridine for symptom relief. - Advise increasing fluid intake, including cranberry juice. - Consider antibiotics if symptoms worsen or if test results indicate a UTI.

## 2024-03-24 ENCOUNTER — Ambulatory Visit: Admitting: Family Medicine

## 2024-03-24 NOTE — Telephone Encounter (Signed)
 Pt stated she spoke with Dr.  Sueanne Emerald and is aware she has medication to be picked up

## 2024-03-28 NOTE — Progress Notes (Signed)
 Remote ICD transmission.

## 2024-04-04 ENCOUNTER — Encounter: Payer: Self-pay | Admitting: Family Medicine

## 2024-04-04 ENCOUNTER — Other Ambulatory Visit: Payer: Self-pay | Admitting: Family Medicine

## 2024-04-04 DIAGNOSIS — N329 Bladder disorder, unspecified: Secondary | ICD-10-CM

## 2024-04-18 ENCOUNTER — Encounter: Payer: Self-pay | Admitting: Cardiology

## 2024-04-18 ENCOUNTER — Other Ambulatory Visit: Payer: Self-pay | Admitting: Orthopedic Surgery

## 2024-04-18 DIAGNOSIS — M545 Low back pain, unspecified: Secondary | ICD-10-CM

## 2024-04-24 ENCOUNTER — Other Ambulatory Visit: Payer: Self-pay | Admitting: Family Medicine

## 2024-04-24 ENCOUNTER — Other Ambulatory Visit: Payer: Self-pay | Admitting: Orthopedic Surgery

## 2024-04-24 DIAGNOSIS — M545 Low back pain, unspecified: Secondary | ICD-10-CM

## 2024-04-24 DIAGNOSIS — E785 Hyperlipidemia, unspecified: Secondary | ICD-10-CM

## 2024-04-24 DIAGNOSIS — G720 Drug-induced myopathy: Secondary | ICD-10-CM

## 2024-04-29 ENCOUNTER — Ambulatory Visit (HOSPITAL_COMMUNITY)
Admission: RE | Admit: 2024-04-29 | Discharge: 2024-04-29 | Disposition: A | Discharge status: Home / Self Care | Source: Ambulatory Visit | Attending: Orthopedic Surgery | Admitting: Orthopedic Surgery

## 2024-04-29 ENCOUNTER — Ambulatory Visit (HOSPITAL_COMMUNITY)
Admission: RE | Admit: 2024-04-29 | Discharge: 2024-04-29 | Disposition: A | Discharge status: Home / Self Care | Source: Ambulatory Visit | Attending: Pulmonary Disease | Admitting: Pulmonary Disease

## 2024-04-29 DIAGNOSIS — M545 Low back pain, unspecified: Secondary | ICD-10-CM | POA: Insufficient documentation

## 2024-04-29 NOTE — CV Procedure (Signed)
  Device system confirmed to be MRI conditional, with implant date > 6 weeks ago, and no evidence of abandoned or epicardial leads in review of most recent CXR  Device last cleared by EP Provider: Daphne Barrack 04/29/24  Clearance is good through for 1 year as long as parameters remain stable at time of check. If pt undergoes a cardiac device procedure during that time, they should be re-cleared.   Tachy-therapies to be programmed off if applicable with device back to pre-MRI settings after completion of exam.  [employer] - Programming recommendation received through [employer] App/Tablet  Izetta CHRISTELLA Linen, RT  04/29/2024 11:37 AM

## 2024-05-05 ENCOUNTER — Encounter: Payer: Self-pay | Admitting: Gastroenterology

## 2024-05-12 ENCOUNTER — Ambulatory Visit (INDEPENDENT_AMBULATORY_CARE_PROVIDER_SITE_OTHER): Admitting: Urology

## 2024-05-12 ENCOUNTER — Encounter: Payer: Self-pay | Admitting: Urology

## 2024-05-12 VITALS — BP 132/75 | HR 75 | Ht 68.0 in | Wt 190.0 lb

## 2024-05-12 DIAGNOSIS — Z87440 Personal history of urinary (tract) infections: Secondary | ICD-10-CM | POA: Diagnosis not present

## 2024-05-12 DIAGNOSIS — R399 Unspecified symptoms and signs involving the genitourinary system: Secondary | ICD-10-CM

## 2024-05-12 LAB — URINALYSIS, COMPLETE
Bilirubin, UA: NEGATIVE
Glucose, UA: NEGATIVE
Ketones, UA: NEGATIVE
Leukocytes,UA: NEGATIVE
Nitrite, UA: NEGATIVE
Protein,UA: NEGATIVE
RBC, UA: NEGATIVE
Specific Gravity, UA: 1.01 (ref 1.005–1.030)
Urobilinogen, Ur: 0.2 mg/dL (ref 0.2–1.0)
pH, UA: 6.5 (ref 5.0–7.5)

## 2024-05-12 LAB — MICROSCOPIC EXAMINATION
Bacteria, UA: NONE SEEN
RBC, Urine: NONE SEEN /HPF (ref 0–2)

## 2024-05-12 MED ORDER — GEMTESA 75 MG PO TABS: 75.0000 mg | ORAL_TABLET | Freq: Every day | ORAL | Status: DC

## 2024-05-12 NOTE — Progress Notes (Signed)
 05/12/2024 3:51 PM   Christine Fitzpatrick 04-12-1956 [id number]  Referring provider: Hope Merle, MD No address on file  Chief Complaint  Patient presents with   Other    HPI: Christine Fitzpatrick is a 68 y.o. female presents for a violation of pelvic discomfort and lower urinary tract symptoms.  Saw Dr Penne 02/07/2023 complaining of right flank pain and vaginal/periurethral burning.  Renal ultrasound was unremarkable, and it was felt her pain was most likely musculoskeletal in etiology.  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 She has continued on this regimen, and her burning symptoms have resolved.  She presents today with a one-month history of discomfort in the right pelvis with voiding, associated with chills. She also reports urinary hesitancy and urgency. Denies dysuria or gross hematuria.   PMH: 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 03/29/2011    Surgical History: Past Surgical History:  Procedure Laterality Date   BREAST EXCISIONAL BIOPSY     CARDIAC DEFIBRILLATOR PLACEMENT     CATARACT EXTRACTION Left    COLONOSCOPY WITH PROPOFOL  N/A 06/20/2023   Procedure: COLONOSCOPY WITH PROPOFOL ;  Surgeon: Therisa Bi, MD;  Location: [facility] ENDOSCOPY;  Service: Gastroenterology;  Laterality: N/A;   MIDDLE EAR SURGERY Left    POLYPECTOMY  06/20/2023   Procedure: POLYPECTOMY;  Surgeon: Therisa Bi, MD;  Location: [facility] ENDOSCOPY;  Service: Gastroenterology;;   SVT ABLATION N/A 07/18/2022   Procedure: SVT ABLATION;  Surgeon: Cindie Ole DASEN, MD;  Location: [facility] INVASIVE CV LAB;  Service: Cardiovascular;  Laterality: N/A;    Home Medications:  Allergies as of 05/12/2024       Reactions   Codeine Hives, Itching, Rash   Morphine And Codeine Hives, Itching, Rash   Sulfa Antibiotics Hives, Itching,  Rash   Sulfamethoxazole-trimethoprim Rash        Medication List        Accurate as of May 12, 2024  3:51 PM. If you have any questions, ask your nurse or doctor.          acetaminophen  500 MG tablet Commonly known as: TYLENOL  Take 500-1,000 mg by mouth every 6 (six) hours as needed (pain.).   azelastine 0.1 % nasal spray Commonly known as: ASTELIN Place 2 sprays into both nostrils in the morning. Use in each nostril as directed   B-complex with vitamin C tablet Take 1 tablet by mouth daily in the afternoon.   calcium  carbonate 1250 (500 Ca) MG chewable tablet Commonly known as: OS-CAL Chew 1 tablet by mouth daily.   cetirizine 10 MG tablet Commonly known as: ZYRTEC Take 10 mg by mouth daily as needed for allergies.   estradiol  0.1 MG/GM vaginal cream Commonly known as: ESTRACE  Discard applicator Apply pea sized amount to tip of finger to urethra before bed. Wash hands well after application. Use Monday, Wednesday and Friday   Gemtesa  75 MG Tabs Generic drug: Vibegron  Take 1 tablet (75 mg total) by mouth daily. Started by: Glendia JAYSON Barba   MAGNESIUM PO Take by mouth daily. Over the counter take by mouth daily   methylPREDNISolone 4 MG Tbpk tablet Commonly known as: MEDROL DOSEPAK Take 4 mg by mouth.   metoprolol  succinate 25 MG 24 hr tablet Commonly known as: TOPROL -XL Take 25 mg (1 tablet) in the morning, and 25 (1 tablet) mg  in the evening.   omeprazole  40 MG capsule Commonly known as: PRILOSEC Take 1 capsule (40 mg total) by mouth daily.   ondansetron  4 MG tablet Commonly known as: Zofran  Take 1 tablet (4 mg total) by mouth every 8 (eight) hours as needed for nausea or vomiting.   Repatha  140 MG/ML Sosy Generic drug: Evolocumab  INJECT 1 SYRINGE SUBCUTANEOUSLY  EVERY 2 WEEKS   Vitamin D  (Ergocalciferol ) 1.25 MG (50000 UNIT) Caps capsule Commonly known as: DRISDOL  TAKE 1 CAPSULE (50,000 UNITS TOTAL) BY MOUTH EVERY 7 (SEVEN) DAYS         Allergies:  Allergies  Allergen Reactions   Codeine Hives, Itching and Rash   Morphine And Codeine Hives, Itching and Rash   Sulfa Antibiotics Hives, Itching and Rash   Sulfamethoxazole-Trimethoprim Rash    Family History: Family History  Problem Relation Age of Onset   Hypertension Mother    Hypertension Maternal Aunt    Diabetes Maternal Grandmother    Breast cancer Neg Hx     Social History:  reports that she has never smoked. She has never used smokeless tobacco. She reports that she does not currently use alcohol. She reports that she does not use drugs.   Physical Exam: BP 132/75   Pulse 75   Ht 5' 8 (1.727 m)   Wt 190 lb (86.2 kg)   BMI 28.89 kg/m   Constitutional:  Alert and oriented, No acute distress. HEENT: [place] AT, moist mucus membranes.  Trachea midline, no masses. Cardiovascular: No clubbing, cyanosis, or edema. Respiratory: Normal respiratory effort, no increased work of breathing. GI: Abdomen is soft, nontender, nondistended, no abdominal masses Skin: No rashes, bruises or suspicious lesions. Neurologic: Grossly intact, no focal deficits, moving all 4 extremities. Psychiatric: Normal mood and affect.   Urinalysis Dipstick/microscopy negative.   Assessment & Plan:    1. Lower urinary tract symptoms with pelvic pain PVR 0 mL Trial Gemtesa  75 mg daily. Samples x4 weeks given, and she will call back or send us  a MyChart message regarding efficacy.  [facility] [address] [place], [place] [address] [phone]

## 2024-05-13 ENCOUNTER — Ambulatory Visit (INDEPENDENT_AMBULATORY_CARE_PROVIDER_SITE_OTHER)

## 2024-05-13 VITALS — BP 128/80 | HR 107 | Temp 98.7°F | Ht 68.0 in | Wt 196.2 lb

## 2024-05-13 DIAGNOSIS — E785 Hyperlipidemia, unspecified: Secondary | ICD-10-CM

## 2024-05-13 DIAGNOSIS — N329 Bladder disorder, unspecified: Secondary | ICD-10-CM | POA: Insufficient documentation

## 2024-05-13 DIAGNOSIS — R197 Diarrhea, unspecified: Secondary | ICD-10-CM | POA: Diagnosis not present

## 2024-05-13 DIAGNOSIS — M170 Bilateral primary osteoarthritis of knee: Secondary | ICD-10-CM

## 2024-05-13 DIAGNOSIS — I4901 Ventricular fibrillation: Secondary | ICD-10-CM

## 2024-05-13 DIAGNOSIS — I471 Supraventricular tachycardia, unspecified: Secondary | ICD-10-CM

## 2024-05-13 DIAGNOSIS — Z789 Other specified health status: Secondary | ICD-10-CM

## 2024-05-13 DIAGNOSIS — N952 Postmenopausal atrophic vaginitis: Secondary | ICD-10-CM

## 2024-05-13 DIAGNOSIS — R7989 Other specified abnormal findings of blood chemistry: Secondary | ICD-10-CM | POA: Diagnosis not present

## 2024-05-13 DIAGNOSIS — K219 Gastro-esophageal reflux disease without esophagitis: Secondary | ICD-10-CM | POA: Insufficient documentation

## 2024-05-13 DIAGNOSIS — Z5181 Encounter for therapeutic drug level monitoring: Secondary | ICD-10-CM

## 2024-05-13 DIAGNOSIS — G720 Drug-induced myopathy: Secondary | ICD-10-CM

## 2024-05-13 DIAGNOSIS — R7303 Prediabetes: Secondary | ICD-10-CM

## 2024-05-13 DIAGNOSIS — R110 Nausea: Secondary | ICD-10-CM | POA: Diagnosis not present

## 2024-05-13 MED ORDER — REPATHA 140 MG/ML ~~LOC~~ SOSY: PREFILLED_SYRINGE | SUBCUTANEOUS | 3 refills | Status: AC

## 2024-05-13 MED ORDER — ESTRADIOL 0.1 MG/GM VA CREA: TOPICAL_CREAM | VAGINAL | 12 refills | Status: AC

## 2024-05-13 MED ORDER — OMEPRAZOLE 40 MG PO CPDR: 40.0000 mg | DELAYED_RELEASE_CAPSULE | Freq: Every day | ORAL | 0 refills | Status: DC

## 2024-05-13 MED ORDER — ONDANSETRON HCL 4 MG PO TABS: 4.0000 mg | ORAL_TABLET | Freq: Three times a day (TID) | ORAL | 0 refills | Status: DC | PRN

## 2024-05-13 NOTE — Assessment & Plan Note (Signed)
 Emphasis on maintaining cholesterol-lowering therapy due to cardiac history. Repeat fasting lipid panel. Patient is concerned Repatha  may be contributing to aches/pain and putting her at risk of recurrent C dif.  Recommend discuss this with cardiologist before she choose to d/c Repatha . Refill on Repatha  sent.

## 2024-05-13 NOTE — Assessment & Plan Note (Deleted)
 Established with urologist, recommend pelvic floor PT. Referral sent.

## 2024-05-13 NOTE — Assessment & Plan Note (Signed)
 Chronic.  Follows [facility]. Continue PT and prn Tylenol  <2gm/day and prn Medrol dose.

## 2024-05-13 NOTE — Assessment & Plan Note (Signed)
 Previously tried Crestor  and Lipitor.  Reports significant myalgias due to this. On Repatha , twice a month continue.

## 2024-05-13 NOTE — Assessment & Plan Note (Signed)
 Check B 12, given patient on Omeprazole  for GERD

## 2024-05-13 NOTE — Assessment & Plan Note (Signed)
>>  ASSESSMENT AND PLAN FOR ELEVATED HEMOGLOBIN A1C WRITTEN ON 05/13/2024  5:11 PM BY Keilah Lemire, MD  Pre-diabetes indicated by hemoglobin A1c of 5.7% in March 2022. Check hemoglobin A1c.

## 2024-05-13 NOTE — Assessment & Plan Note (Addendum)
 Plan per diarrhea.

## 2024-05-13 NOTE — Assessment & Plan Note (Signed)
 Pre-diabetes indicated by hemoglobin A1c of 5.7% in March 2022. Check hemoglobin A1c.

## 2024-05-13 NOTE — Assessment & Plan Note (Signed)
 Continue f/u with cardiology, continue Metoprolol .

## 2024-05-13 NOTE — Assessment & Plan Note (Signed)
 Check vitamin D  level, management pending result.

## 2024-05-13 NOTE — Progress Notes (Signed)
 Established Patient Office Visit TOC form Dr. Hope    Subjective  Patient ID: Christine Fitzpatrick, female    DOB: 11-21-1955  Age: 68 y.o. MRN: [id number]  Chief Complaint  Patient presents with   Establish Care   She  has a past medical history of Annual physical exam (11/21/2021), Aspiration pneumonia ([name]) (03/05/2012), Axillary lymphadenopathy (09/06/2017), Breast cancer screening by mammogram (04/08/2023), C. difficile diarrhea, Cardiac arrest ([name]) (02/29/2012), Cervical cancer screening (04/08/2023), Hyperlipidemia, Hypertension, and Sudden cardiac death due to cardiac arrhythmia (03/29/2011).  HPI Discussed the use of AI scribe software for clinical note transcription with the patient, who gave verbal consent to proceed.  History of Present Illness Christine Fitzpatrick is a 69 year old female with h/o c dif, arthritis, vitamin D  deficiency. H/o cardiac arrest in 2012 presents to establish care.   GI: She has been experiencing ongoing gastrointestinal symptoms, including diarrhea (improving), stomach aches, and nausea, for the past few weeks. The diarrhea was watery two days ago and is now more mushy. She has a loss of appetite and feels nauseated, describing it as feeling 'like I'm pregnant.' Despite following a bland diet of bananas, rice, toast, and applesauce, she is unable to eat anything without it going through her. She has seen Dr. Therisa in the past and is looking to reestablish care with him.   She reports significant joint pain, describing it as 'achy' and similar to the pain experienced while on statins. She has been on Repatha  for cholesterol management for about two years and suspects it may be contributing to her symptoms. The joint pain affects her knees, back, neck, and hips, and she feels stiff after sitting for extended periods. She is currently undergoing physical therapy for her back pain.  Her current medications include Tylenol  for arthritis pain, metoprolol . She takes omeprazole   daily for stomach issues, Zyrtec for allergies, and a Medrol Dosepak as needed for hip and back pain. She also takes vitamin D  weekly, calcium  with vitamin D , and B complex vitamins occasionally. She uses a vaginal cream every other day for vaginal dryness dryness. Saw Dr. Twylla (urologist on 7/725) and was started on Vibegron  75 mg daily for LUTS which she has not started yet.     ROS As per HPI    Objective:     BP 128/80 (BP Location: Right Arm, Patient Position: Sitting, Cuff Size: Normal)   Pulse (!) 107   Temp 98.7 F (37.1 C) (Oral)   Ht 5' 8 (1.727 m)   Wt 196 lb 3.2 oz (89 kg)   SpO2 96%   BMI 29.83 kg/m      05/13/2024    4:13 PM 03/07/2024    8:37 AM 03/07/2024    8:14 AM  Depression screen PHQ 2/9  Decreased Interest 0 0 0  Down, Depressed, Hopeless 0 0 0  PHQ - 2 Score 0 0 0  Altered sleeping 0 1   Tired, decreased energy 0 0   Change in appetite 0 1   Feeling bad or failure about yourself  0 0   Trouble concentrating 0 0   Moving slowly or fidgety/restless 0 0   Suicidal thoughts 0 0   PHQ-9 Score 0 2   Difficult doing work/chores Not difficult at all Not difficult at all       05/13/2024    4:14 PM 03/07/2024    8:37 AM 12/20/2023    8:47 AM  GAD 7 : Generalized Anxiety Score  Nervous, Anxious, on  Edge 0 0 0  Control/stop worrying 0 0 0  Worry too much - different things 0 0 0  Trouble relaxing 0 0 0  Restless 0 0 0  Easily annoyed or irritable 0 0 0  Afraid - awful might happen 0 0 0  Total GAD 7 Score 0 0 0  Anxiety Difficulty Not difficult at all Not difficult at all Not difficult at all      05/13/2024    4:13 PM 03/07/2024    8:37 AM 03/07/2024    8:14 AM  Depression screen PHQ 2/9  Decreased Interest 0 0 0  Down, Depressed, Hopeless 0 0 0  PHQ - 2 Score 0 0 0  Altered sleeping 0 1   Tired, decreased energy 0 0   Change in appetite 0 1   Feeling bad or failure about yourself  0 0   Trouble concentrating 0 0   Moving slowly or fidgety/restless  0 0   Suicidal thoughts 0 0   PHQ-9 Score 0 2   Difficult doing work/chores Not difficult at all Not difficult at all       05/13/2024    4:14 PM 03/07/2024    8:37 AM 12/20/2023    8:47 AM  GAD 7 : Generalized Anxiety Score  Nervous, Anxious, on Edge 0 0 0  Control/stop worrying 0 0 0  Worry too much - different things 0 0 0  Trouble relaxing 0 0 0  Restless 0 0 0  Easily annoyed or irritable 0 0 0  Afraid - awful might happen 0 0 0  Total GAD 7 Score 0 0 0  Anxiety Difficulty Not difficult at all Not difficult at all Not difficult at all   SDOH Screenings   Food Insecurity: No Food Insecurity (05/10/2024)  Housing: Low Risk  (05/10/2024)  Transportation Needs: No Transportation Needs (05/10/2024)  Utilities: Not At Risk (03/05/2024)  Alcohol Screen: Low Risk  (03/07/2024)  Depression (PHQ2-9): Low Risk  (05/13/2024)  Financial Resource Strain: Low Risk  (05/10/2024)  Physical Activity: Insufficiently Active (05/10/2024)  Social Connections: Socially Integrated (05/10/2024)  Stress: No Stress Concern Present (05/10/2024)  Tobacco Use: Low Risk  (05/13/2024)  Health Literacy: Adequate Health Literacy (03/07/2024)     Physical Exam Constitutional:      Appearance: Normal appearance.  HENT:     Head: Normocephalic and atraumatic.     Right Ear: Tympanic membrane normal.     Left Ear: Tympanic membrane normal.     Mouth/Throat:     Mouth: Mucous membranes are moist.  Neck:     Thyroid : No thyroid  mass or thyroid  tenderness.  Cardiovascular:     Rate and Rhythm: Normal rate and regular rhythm.     Heart sounds: Murmur (grade II/VI most prominent on right ICS) heard.  Pulmonary:     Effort: Pulmonary effort is normal.     Breath sounds: Normal breath sounds.  Abdominal:     General: Bowel sounds are normal.     Palpations: Abdomen is soft.     Tenderness: There is no abdominal tenderness. There is no guarding.  Musculoskeletal:     Cervical back: Neck supple. No rigidity.     Right lower  leg: No edema.     Left lower leg: No edema.  Lymphadenopathy:     Cervical: No cervical adenopathy.  Skin:    General: Skin is warm.  Neurological:     Mental Status: She is alert and oriented to person, place,  and time.  Psychiatric:        Mood and Affect: Mood normal.        Behavior: Behavior normal.        No results found for any visits on 05/13/24.  The ASCVD Risk score (Arnett DK, et al., 2019) failed to calculate for the following reasons:   Unable to determine if patient is Non-Hispanic African American     Assessment & Plan:    Diarrhea, unspecified type Assessment & Plan: - Refer to gastroenterologist Dr. Therisa for further evaluation. Dr. Williemae office plans on calling patient to schedule an appointment. Check CBC, CMP - Continue bland diet (bananas, rice, applesauce, toast). - Ensure adequate hydration (50-60 ounces of water daily). - Send refill for omeprazole  40 mg done, take it in empty stomach.  - Continue ondansetron  4 mg, q8 hourly as needed for nausea.  Orders: -     CBC with Differential/Platelet; Future -     Comprehensive metabolic panel with GFR; Future -     Ambulatory referral to Gastroenterology  Hyperlipidemia, unspecified hyperlipidemia type Assessment & Plan: Emphasis on maintaining cholesterol-lowering therapy due to cardiac history. Repeat fasting lipid panel. Patient is concerned Repatha  may be contributing to aches/pain and putting her at risk of recurrent C dif.  Recommend discuss this with cardiologist before she choose to d/c Repatha . Refill on Repatha  sent.   Orders: -     Repatha ; INJECT 1 SYRINGE SUBCUTANEOUSLY  EVERY 2 WEEKS  Dispense: 6 mL; Refill: 3 -     Lipid panel; Future  Nausea Assessment & Plan: Plan per diarrhea  Orders: -     Ondansetron  HCl; Take 1 tablet (4 mg total) by mouth every 8 (eight) hours as needed for nausea or vomiting.  Dispense: 20 tablet; Refill: 0  Low serum vitamin D  Assessment & Plan: Check  vitamin D  level, management pending result   Prediabetes Assessment & Plan:  >>ASSESSMENT AND PLAN FOR ELEVATED HEMOGLOBIN A1C WRITTEN ON 05/13/2024  5:11 PM BY ABBEY BRUCKNER, MD  Pre-diabetes indicated by hemoglobin A1c of 5.7% in March 2022. Check hemoglobin A1c.  Orders: -     Hemoglobin A1c; Future  Medication monitoring encounter Assessment & Plan: Check B 12, given patient on Omeprazole  for GERD  Orders: -     Vitamin B12; Future  Gastroesophageal reflux disease, unspecified whether esophagitis present -     Omeprazole ; Take 1 capsule (40 mg total) by mouth daily.  Dispense: 90 capsule; Refill: 0  Urinary bladder disorder Assessment & Plan: Established with urologist, recommend pelvic floor PT. Referral sent.   Orders: -     Ambulatory referral to Physical Therapy  Post-menopausal atrophic vaginitis Assessment & Plan: - Uses vaginal estrogen cream/Estradiol  for dryness, aware of potential risks, adheres to prescribed regimen (3 days a week). - Send refill for vaginal estrogen cream.   Orders: -     Estradiol ; Discard applicator Apply pea sized amount to tip of finger to urethra before bed. Wash hands well after application. Use Monday, Wednesday and Friday  Dispense: 42.5 g; Refill: 12  Ventricular fibrillation [facility]) Assessment & Plan: With h/o cardiac arrest in 2012. Continue f/u with cardiology.    Supraventricular tachycardia ([name]) Assessment & Plan: Continue f/u with cardiology, continue Metoprolol .    Statin intolerance Assessment & Plan: Previously tried Crestor  and Lipitor.  Reports significant myalgias due to this. On Repatha , twice a month continue.    Orders: -     Repatha ; INJECT 1 SYRINGE SUBCUTANEOUSLY  EVERY 2 WEEKS  Dispense: 6 mL; Refill: 3  Arthritis of both knees Assessment & Plan: Chronic.  Follows [facility]. Continue PT and prn Tylenol  <2gm/day and prn Medrol dose.     I spent 50 minutes on the day of this face-to-face encounter  reviewing the patient's medical and surgical history, medications, ongoing concerns, and reviewing the assessment and plan with the patient. This time also included counseling the patient on their health conditions and management options. Additionally, I spent time post-visit ordering and reviewing diagnostics and therapeutics with the patient.   Return in about 6 months (around 11/13/2024) for Chronic. SABRA   Luke Shade, MD

## 2024-05-13 NOTE — Assessment & Plan Note (Signed)
 Established with urologist, recommend pelvic floor PT. Referral sent.

## 2024-05-13 NOTE — Assessment & Plan Note (Addendum)
-   Refer to gastroenterologist Dr. Therisa for further evaluation. Dr. Williemae office plans on calling patient to schedule an appointment. Check CBC, CMP - Continue bland diet (bananas, rice, applesauce, toast). - Ensure adequate hydration (50-60 ounces of water daily). - Send refill for omeprazole  40 mg done, take it in empty stomach.  - Continue ondansetron  4 mg, q8 hourly as needed for nausea.

## 2024-05-13 NOTE — Assessment & Plan Note (Signed)
-   Uses vaginal estrogen cream/Estradiol  for dryness, aware of potential risks, adheres to prescribed regimen (3 days a week). - Send refill for vaginal estrogen cream.

## 2024-05-13 NOTE — Assessment & Plan Note (Signed)
 With h/o cardiac arrest in 2012. Continue f/u with cardiology.

## 2024-05-14 ENCOUNTER — Encounter: Payer: Self-pay | Admitting: Urology

## 2024-05-14 ENCOUNTER — Ambulatory Visit: Payer: Managed Care, Other (non HMO)

## 2024-05-14 DIAGNOSIS — I471 Supraventricular tachycardia, unspecified: Secondary | ICD-10-CM | POA: Diagnosis not present

## 2024-05-15 LAB — CUP PACEART REMOTE DEVICE CHECK
Battery Remaining Longevity: 129 mo
Battery Voltage: 3.02 V
Brady Statistic RV Percent Paced: 0.01 %
Date Time Interrogation Session: 20250708190310
HighPow Impedance: 41 Ohm
HighPow Impedance: 53 Ohm
Implantable Lead Connection Status: 753985
Implantable Lead Implant Date: 20130501
Implantable Lead Location: 753860
Implantable Pulse Generator Implant Date: 20220322
Lead Channel Impedance Value: 570 Ohm
Lead Channel Impedance Value: 646 Ohm
Lead Channel Pacing Threshold Amplitude: 0.5 V
Lead Channel Pacing Threshold Pulse Width: 0.4 ms
Lead Channel Sensing Intrinsic Amplitude: 9 mV
Lead Channel Setting Pacing Amplitude: 1.5 V
Lead Channel Setting Pacing Pulse Width: 0.4 ms
Lead Channel Setting Sensing Sensitivity: 0.45 mV
Zone Setting Status: 755011

## 2024-05-16 ENCOUNTER — Ambulatory Visit: Payer: Self-pay

## 2024-05-16 ENCOUNTER — Other Ambulatory Visit (INDEPENDENT_AMBULATORY_CARE_PROVIDER_SITE_OTHER)

## 2024-05-16 DIAGNOSIS — R7303 Prediabetes: Secondary | ICD-10-CM | POA: Diagnosis not present

## 2024-05-16 DIAGNOSIS — Z5181 Encounter for therapeutic drug level monitoring: Secondary | ICD-10-CM | POA: Diagnosis not present

## 2024-05-16 DIAGNOSIS — R197 Diarrhea, unspecified: Secondary | ICD-10-CM | POA: Diagnosis not present

## 2024-05-16 DIAGNOSIS — E785 Hyperlipidemia, unspecified: Secondary | ICD-10-CM | POA: Diagnosis not present

## 2024-05-16 LAB — CBC WITH DIFFERENTIAL/PLATELET
Basophils Absolute: 0 K/uL (ref 0.0–0.1)
Basophils Relative: 0.5 % (ref 0.0–3.0)
Eosinophils Absolute: 0.1 K/uL (ref 0.0–0.7)
Eosinophils Relative: 2.2 % (ref 0.0–5.0)
HCT: 42.3 % (ref 36.0–46.0)
Hemoglobin: 13.9 g/dL (ref 12.0–15.0)
Lymphocytes Relative: 38.3 % (ref 12.0–46.0)
Lymphs Abs: 2.3 K/uL (ref 0.7–4.0)
MCHC: 32.9 g/dL (ref 30.0–36.0)
MCV: 95 fl (ref 78.0–100.0)
Monocytes Absolute: 0.5 K/uL (ref 0.1–1.0)
Monocytes Relative: 8.8 % (ref 3.0–12.0)
Neutro Abs: 3 K/uL (ref 1.4–7.7)
Neutrophils Relative %: 50.2 % (ref 43.0–77.0)
Platelets: 306 K/uL (ref 150.0–400.0)
RBC: 4.45 Mil/uL (ref 3.87–5.11)
RDW: 12.9 % (ref 11.5–15.5)
WBC: 6 K/uL (ref 4.0–10.5)

## 2024-05-16 LAB — LIPID PANEL
Cholesterol: 145 mg/dL (ref 0–200)
HDL: 53.6 mg/dL (ref >39.00)
LDL Cholesterol: 65 mg/dL (ref 0–99)
NonHDL: 91.82
Total CHOL/HDL Ratio: 3
Triglycerides: 132 mg/dL (ref 0.0–149.0)
VLDL: 26.4 mg/dL (ref 0.0–40.0)

## 2024-05-16 LAB — COMPREHENSIVE METABOLIC PANEL WITH GFR
ALT: 26 U/L (ref 0–35)
AST: 20 U/L (ref 0–37)
Albumin: 4.3 g/dL (ref 3.5–5.2)
Alkaline Phosphatase: 77 U/L (ref 39–117)
BUN: 10 mg/dL (ref 6–23)
CO2: 29 meq/L (ref 19–32)
Calcium: 9.5 mg/dL (ref 8.4–10.5)
Chloride: 106 meq/L (ref 96–112)
Creatinine, Ser: 0.86 mg/dL (ref 0.40–1.20)
GFR: 70.68 mL/min (ref >60.00)
Glucose, Bld: 101 mg/dL — ABNORMAL HIGH (ref 70–99)
Potassium: 4.2 meq/L (ref 3.5–5.1)
Sodium: 139 meq/L (ref 135–145)
Total Bilirubin: 0.5 mg/dL (ref 0.2–1.2)
Total Protein: 7.1 g/dL (ref 6.0–8.3)

## 2024-05-16 LAB — HEMOGLOBIN A1C: Hgb A1c MFr Bld: 5.9 % (ref 4.6–6.5)

## 2024-05-16 LAB — VITAMIN B12: Vitamin B-12: 497 pg/mL (ref 211–911)

## 2024-05-17 ENCOUNTER — Ambulatory Visit: Payer: Self-pay | Admitting: Cardiology

## 2024-06-15 ENCOUNTER — Encounter: Payer: Self-pay | Admitting: Urology

## 2024-06-16 ENCOUNTER — Other Ambulatory Visit: Payer: Self-pay

## 2024-06-16 MED ORDER — GEMTESA 75 MG PO TABS
75.0000 mg | ORAL_TABLET | Freq: Every day | ORAL | 6 refills | Status: AC
Start: 2024-06-16 — End: ?

## 2024-06-21 ENCOUNTER — Ambulatory Visit

## 2024-06-22 ENCOUNTER — Encounter: Payer: Self-pay | Admitting: Intensive Care

## 2024-06-22 ENCOUNTER — Ambulatory Visit
Admission: RE | Admit: 2024-06-22 | Discharge: 2024-06-22 | Disposition: A | Discharge status: Home / Self Care | Source: Ambulatory Visit

## 2024-06-22 ENCOUNTER — Emergency Department
Admission: EM | Admit: 2024-06-22 | Discharge: 2024-06-22 | Disposition: A | Discharge status: Home / Self Care | Attending: Emergency Medicine | Admitting: Emergency Medicine

## 2024-06-22 ENCOUNTER — Other Ambulatory Visit: Payer: Self-pay

## 2024-06-22 ENCOUNTER — Emergency Department

## 2024-06-22 DIAGNOSIS — K5732 Diverticulitis of large intestine without perforation or abscess without bleeding: Secondary | ICD-10-CM | POA: Insufficient documentation

## 2024-06-22 DIAGNOSIS — R1032 Left lower quadrant pain: Secondary | ICD-10-CM

## 2024-06-22 DIAGNOSIS — K5792 Diverticulitis of intestine, part unspecified, without perforation or abscess without bleeding: Secondary | ICD-10-CM

## 2024-06-22 DIAGNOSIS — R1030 Lower abdominal pain, unspecified: Secondary | ICD-10-CM | POA: Diagnosis present

## 2024-06-22 LAB — URINALYSIS, ROUTINE W REFLEX MICROSCOPIC
Bilirubin Urine: NEGATIVE
Glucose, UA: NEGATIVE mg/dL
Hgb urine dipstick: NEGATIVE
Ketones, ur: NEGATIVE mg/dL
Leukocytes,Ua: NEGATIVE
Nitrite: NEGATIVE
Protein, ur: NEGATIVE mg/dL
Specific Gravity, Urine: 1.024 (ref 1.005–1.030)
pH: 5 (ref 5.0–8.0)

## 2024-06-22 LAB — COMPREHENSIVE METABOLIC PANEL WITH GFR
ALT: 23 U/L (ref 0–44)
AST: 20 U/L (ref 15–41)
Albumin: 3.8 g/dL (ref 3.5–5.0)
Alkaline Phosphatase: 79 U/L (ref 38–126)
Anion gap: 10 (ref 5–15)
BUN: 11 mg/dL (ref 8–23)
CO2: 22 mmol/L (ref 22–32)
Calcium: 9.2 mg/dL (ref 8.9–10.3)
Chloride: 109 mmol/L (ref 98–111)
Creatinine, Ser: 1.14 mg/dL — ABNORMAL HIGH (ref 0.44–1.00)
GFR, Estimated: 53 mL/min — ABNORMAL LOW (ref >60)
Glucose, Bld: 149 mg/dL — ABNORMAL HIGH (ref 70–99)
Potassium: 4 mmol/L (ref 3.5–5.1)
Sodium: 141 mmol/L (ref 135–145)
Total Bilirubin: 0.6 mg/dL (ref 0.0–1.2)
Total Protein: 7.3 g/dL (ref 6.5–8.1)

## 2024-06-22 LAB — CBC
HCT: 41.4 % (ref 36.0–46.0)
Hemoglobin: 13.7 g/dL (ref 12.0–15.0)
MCH: 31.9 pg (ref 26.0–34.0)
MCHC: 33.1 g/dL (ref 30.0–36.0)
MCV: 96.5 fL (ref 80.0–100.0)
Platelets: 286 K/uL (ref 150–400)
RBC: 4.29 MIL/uL (ref 3.87–5.11)
RDW: 12.6 % (ref 11.5–15.5)
WBC: 9.6 K/uL (ref 4.0–10.5)
nRBC: 0 % (ref 0.0–0.2)

## 2024-06-22 LAB — LIPASE, BLOOD: Lipase: 28 U/L (ref 11–51)

## 2024-06-22 MED ORDER — AMOXICILLIN-POT CLAVULANATE 875-125 MG PO TABS
1.0000 | ORAL_TABLET | Freq: Once | ORAL | Status: AC
  Administered 2024-06-22: 1 via ORAL
  Filled 2024-06-22: qty 1

## 2024-06-22 MED ORDER — SODIUM CHLORIDE 0.9 % IV BOLUS
1000.0000 mL | Freq: Once | INTRAVENOUS | Status: AC
  Administered 2024-06-22: 1000 mL via INTRAVENOUS

## 2024-06-22 MED ORDER — IOHEXOL 300 MG/ML  SOLN
100.0000 mL | Freq: Once | INTRAMUSCULAR | Status: AC | PRN
  Administered 2024-06-22: 100 mL via INTRAVENOUS

## 2024-06-22 MED ORDER — ONDANSETRON HCL 4 MG/2ML IJ SOLN
4.0000 mg | Freq: Once | INTRAMUSCULAR | Status: AC
  Administered 2024-06-22: 4 mg via INTRAVENOUS
  Filled 2024-06-22: qty 2

## 2024-06-22 MED ORDER — ONDANSETRON 4 MG PO TBDP
4.0000 mg | ORAL_TABLET | Freq: Three times a day (TID) | ORAL | 0 refills | Status: AC | PRN
Start: 2024-06-22 — End: ?

## 2024-06-22 MED ORDER — AMOXICILLIN-POT CLAVULANATE 875-125 MG PO TABS: 1.0000 | ORAL_TABLET | Freq: Two times a day (BID) | ORAL | 0 refills | Status: AC

## 2024-06-22 MED ORDER — KETOROLAC TROMETHAMINE 15 MG/ML IJ SOLN
15.0000 mg | Freq: Once | INTRAMUSCULAR | Status: AC
  Administered 2024-06-22: 15 mg via INTRAVENOUS
  Filled 2024-06-22: qty 1

## 2024-06-22 NOTE — Discharge Instructions (Addendum)
 You are seen in the emergency department for lower abdominal pain.  You had findings of questionable diverticulitis on your CT scan.  You were started on an antibiotic.  Clear liquid diet and bowel rest with slowly reintroducing food as your symptoms improved.  If you have ongoing symptoms or worsening symptoms you can develop complications of diverticulitis, return to the emergency department for reevaluation.  Stay hydrated and drink plenty of fluids.  Follow-up with your gastroenterologist.  Call your primary care physician to schedule close follow-up appointment.  You are started on antibiotic, take as prescribed.  You are also given a prescription for nausea medication.    Pain control:  Ibuprofen (motrin/aleve/advil) - You can take 3 tablets (600 mg) every 6 hours as needed for pain/fever.  Acetaminophen  (tylenol ) - You can take 2 extra strength tablets (1000 mg) every 6 hours as needed for pain/fever.  You can alternate these medications or take them together.  Make sure you eat food/drink water when taking these medications.

## 2024-06-22 NOTE — ED Provider Notes (Signed)
 [facility] Provider Note    Event Date/Time   First MD Initiated Contact with Patient 06/22/24 1726     (approximate)   History   Abdominal Pain   HPI  Christine Fitzpatrick is a 68 y.o. female presents to the emergency department with lower abdominal pain.  Patient endorses 4 days of lower abdominal pain.  Associated with mild nausea but no episodes of vomiting.  No fever or chills.  Has had mild burning with urination but does not feel similar to her prior urinary tract infection.  No history of kidney stones.  Prior colonoscopy approximately 1 year ago that showed some polyps and had recommended a repeat colonoscopy in 3 years.  Does have a history of C. difficile.  Works as a Engineer, civil (consulting).  Taking Tylenol  for pain control     Physical Exam   Triage Vital Signs: ED Triage Vitals  Encounter Vitals Group     BP 06/22/24 1513 132/84     Girls Systolic BP Percentile --      Girls Diastolic BP Percentile --      Boys Systolic BP Percentile --      Boys Diastolic BP Percentile --      Pulse Rate 06/22/24 1513 90     Resp 06/22/24 1513 16     Temp 06/22/24 1513 98.9 F (37.2 C)     Temp Source 06/22/24 1513 Oral     SpO2 06/22/24 1513 98 %     Weight 06/22/24 1514 190 lb (86.2 kg)     Height 06/22/24 1514 5' 8 (1.727 m)     Head Circumference --      Peak Flow --      Pain Score 06/22/24 1514 4     Pain Loc --      Pain Education --      Exclude from Growth Chart --     Most recent vital signs: Vitals:   06/22/24 1513 06/22/24 1912  BP: 132/84 122/73  Pulse: 90 79  Resp: 16 18  Temp: 98.9 F (37.2 C) 97.9 F (36.6 C)  SpO2: 98% 99%    Physical Exam Constitutional:      Appearance: She is well-developed.  HENT:     Head: Atraumatic.  Eyes:     Conjunctiva/sclera: Conjunctivae normal.  Cardiovascular:     Rate and Rhythm: Regular rhythm.  Pulmonary:     Effort: No respiratory distress.  Abdominal:     General: There is no distension.      Tenderness: There is abdominal tenderness in the right lower quadrant and left lower quadrant. There is no right CVA tenderness, left CVA tenderness, guarding or rebound.  Musculoskeletal:        General: Normal range of motion.     Cervical back: Normal range of motion.  Skin:    General: Skin is warm.  Neurological:     Mental Status: She is alert. Mental status is at baseline.     IMPRESSION / MDM / ASSESSMENT AND PLAN / ED COURSE  I reviewed the triage vital signs and the nursing notes.  Differential diagnosis including acute appendicitis, diverticulitis, intra-abdominal abscess, malignancy, urinary tract infection, pyelonephritis   RADIOLOGY CT scan abdomen and pelvis with contrast showed scattered diverticuli and stranding adjacent to the mid sigmoid colon concerning for early active diverticulitis.  LABS (all labs ordered are listed, but only abnormal results are displayed) Labs interpreted as -    Labs Reviewed  COMPREHENSIVE METABOLIC  PANEL WITH GFR - Abnormal; Notable for the following components:      Result Value   Glucose, Bld 149 (*)    Creatinine, Ser 1.14 (*)    GFR, Estimated 53 (*)    All other components within normal limits  URINALYSIS, ROUTINE W REFLEX MICROSCOPIC - Abnormal; Notable for the following components:   Color, Urine YELLOW (*)    APPearance CLEAR (*)    All other components within normal limits  LIPASE, BLOOD  CBC     MDM  Patient without significant leukocytosis.  Creatinine mildly elevated from her baseline up to 1.1.  Urine with no signs of urinary tract infection.  No significant electrolyte abnormality.  Patient was given IV fluids and IV ketorolac .  On reevaluation significant improvement of her pain.  Clinical picture is most consistent with diverticulitis.  Given first dose of Augmentin  in the emergency department.  Discussed symptomatic treatment, bowel rest and return precautions.  Will start on antibiotics.  Discussed  follow-up with primary care physician, gastroenterology and return to the emergency department for any worsening symptoms or ongoing symptoms.  No questions at time of discharge.     PROCEDURES:  Critical Care performed: No  Procedures  Patient's presentation is most consistent with acute presentation with potential threat to life or bodily function.   MEDICATIONS ORDERED IN ED: Medications  amoxicillin -clavulanate (AUGMENTIN ) 875-125 MG per tablet 1 tablet (has no administration in time range)  ketorolac  (TORADOL ) 15 MG/ML injection 15 mg (15 mg Intravenous Given 06/22/24 1837)  sodium chloride  0.9 % bolus 1,000 mL (1,000 mLs Intravenous New Bag/Given 06/22/24 1837)  ondansetron  (ZOFRAN ) injection 4 mg (4 mg Intravenous Given 06/22/24 1837)  iohexol  (OMNIPAQUE ) 300 MG/ML solution 100 mL (100 mLs Intravenous Contrast Given 06/22/24 1854)    FINAL CLINICAL IMPRESSION(S) / ED DIAGNOSES   Final diagnoses:  Left lower quadrant abdominal pain  Diverticulitis     Rx / DC Orders   ED Discharge Orders          Ordered    amoxicillin -clavulanate (AUGMENTIN ) 875-125 MG tablet  2 times daily        06/22/24 1923    ondansetron  (ZOFRAN -ODT) 4 MG disintegrating tablet  Every 8 hours PRN        06/22/24 1923             Note:  This document was prepared using Dragon voice recognition software and may include unintentional dictation errors.   Suzanne Kirsch, MD 06/22/24 GENNIE

## 2024-06-22 NOTE — ED Triage Notes (Signed)
 Patient c/o abdominal pain since Thursday with nausea. Reports aching and cramping has gradually gotten worse

## 2024-06-22 NOTE — ED Triage Notes (Signed)
 Patient reports lower abdominal pain x 4 days. Patient denies urinary symptoms. Patient has taken Tums, and Omeprazole . Rates 4/10.

## 2024-06-23 ENCOUNTER — Encounter: Payer: Self-pay | Admitting: Urology

## 2024-06-26 ENCOUNTER — Telehealth: Payer: Self-pay

## 2024-06-26 ENCOUNTER — Ambulatory Visit (INDEPENDENT_AMBULATORY_CARE_PROVIDER_SITE_OTHER)

## 2024-06-26 ENCOUNTER — Telehealth: Payer: Self-pay | Admitting: Cardiology

## 2024-06-26 VITALS — BP 126/82 | HR 82 | Temp 98.4°F | Ht 68.0 in | Wt 192.0 lb

## 2024-06-26 DIAGNOSIS — K219 Gastro-esophageal reflux disease without esophagitis: Secondary | ICD-10-CM

## 2024-06-26 DIAGNOSIS — E782 Mixed hyperlipidemia: Secondary | ICD-10-CM | POA: Diagnosis not present

## 2024-06-26 DIAGNOSIS — K5792 Diverticulitis of intestine, part unspecified, without perforation or abscess without bleeding: Secondary | ICD-10-CM | POA: Insufficient documentation

## 2024-06-26 DIAGNOSIS — R7989 Other specified abnormal findings of blood chemistry: Secondary | ICD-10-CM | POA: Diagnosis not present

## 2024-06-26 DIAGNOSIS — N329 Bladder disorder, unspecified: Secondary | ICD-10-CM

## 2024-06-26 NOTE — Telephone Encounter (Signed)
 Reviewed and confirmed upcoming appointments with patient and she verbalized understanding with no further questions.

## 2024-06-26 NOTE — Assessment & Plan Note (Signed)
-   CT abdomen pelvis from 06/22/24 done in the ED: scattered colonic diverticula. Stranding adjacent 2 8 mid sigmoid colon diverticulum compatible with early active diverticulitis. - Since ED discharge patient has seen gastroenterologist Dr. Therisa and she is currently on Augmentin .  - Vitals normal, abdominal exam reassuring, mild abdominal discomfort likely multifactorial including antibiotic use, recent diagnosis of diverticulitis.  Recommend patient finish antibiotic dose.  Will repeat CMP today.  Patient did notice constipation when she was on Gemtesa  and has noted improvement in stool consistency since stopping Gemtesa .  She has upcoming appointment with urologist for further evaluation.

## 2024-06-26 NOTE — Telephone Encounter (Signed)
 I contacted patient, LVM to call back to discuss.  Left call back number.  Holding appointment slot for tomorrow. Will hold until this afternoon.   Thanks!

## 2024-06-26 NOTE — Assessment & Plan Note (Signed)
 Patient's cholesterol has been stable since starting Repatha  twice a month.  Patient is concerned her GI symptoms are related to Repatha .  She will hold Repatha  for 1 month till she is evaluated by cardiology during upcoming appointment on 07/30/2024.  If she does not get repeat lipid panel during her cardiology appointment I recommend patient reach out to our clinic and we will have repeat lipid panel.

## 2024-06-26 NOTE — Assessment & Plan Note (Signed)
 Component     Latest Ref Rng 06/22/2024  Sodium     135 - 145 mmol/L 141   Potassium     3.5 - 5.1 mmol/L 4.0   Chloride     98 - 111 mmol/L 109   CO2     22 - 32 mmol/L 22   Glucose     70 - 99 mg/dL 850 (H)   BUN     8 - 23 mg/dL 11   Creatinine     9.55 - 1.00 mg/dL 8.85 (H)   Calcium      8.9 - 10.3 mg/dL 9.2   Total Protein     6.5 - 8.1 g/dL 7.3   Albumin     3.5 - 5.0 g/dL 3.8   AST     15 - 41 U/L 20   ALT     0 - 44 U/L 23   Alkaline Phosphatase     38 - 126 U/L 79   Total Bilirubin     0.0 - 1.2 mg/dL 0.6   GFR, Estimated     >60 mL/min 53 (L)   Anion gap     5 - 15  10     Elevated creatinine, reduced GFR, elevated nonfasting blood glucose from baseline when she was seen in the ED.  This is likely related to reduced oral intake, diverticulitis.  We will repeat CMP today.  Patient has a history of prediabetes we discussed lifestyle modifications and repeat A1c in 2 months.

## 2024-06-26 NOTE — Telephone Encounter (Signed)
 Pt stating she cannot make tomorrow. She states 9/24 is fine for her to keep

## 2024-06-26 NOTE — Assessment & Plan Note (Signed)
 She was previously taking Gemtesa  but has stopped taking this due to concerns related to side effect including urinary retention, increased risk of urinary tract infection, constipation.  She plans on following up with urology and has an upcoming appointment with them.

## 2024-06-26 NOTE — Assessment & Plan Note (Signed)
 She is not taking omeprazole . She is on famotidine 40 mg at nighttime.  She has only taken this for couple of days and has not noticed significant improvement in her symptoms.  She will continue to take this medication follow-up with GI as well.

## 2024-06-26 NOTE — Telephone Encounter (Signed)
 Left detailed voicemail message with appointment date and time with instructions to call back if any questions

## 2024-06-26 NOTE — Telephone Encounter (Signed)
 Called patient to discuss palpitations increase.   Unable to reach, LVM to call back- holding spot for tomorrow for her. Advised patient to call back, left call back number.

## 2024-06-26 NOTE — Telephone Encounter (Signed)
 STAT if HR is under 50 or over 120 (normal HR is 60-100 beats per minute)  What is your heart rate? Under 100  Do you have a log of your heart rate readings (document readings)? no  Do you have any other symptoms? Heart palpitation

## 2024-06-26 NOTE — Progress Notes (Signed)
 Acute Office Visit  Subjective:    Patient ID: Christine Fitzpatrick, female    DOB: [birth year], 68 y.o.   MRN: [id number]  Chief Complaint  Patient presents with   Follow-up    ER follow up- abnormal labs, diverticulitis and UTI    Patient is in today for ED follow up. HPI Patient is a 68 year old pleasant female who presents for ED follow-up from 06/22/2024 for abdominal pain.  Patient was evaluated in the ED for left lower abdominal pain.  CT abdomen and pelvis showed scattered diverticuli with stranding adjacent to mid sigmoid colon concerning for early diverticulitis.  She was also found to have elevated creatinine from baseline, mildly low GFR from her baseline and elevated blood glucose when she was in the ED.  Since her ED visit patient has seen GI specialist Dr. Therisa.  She is taking Augmentin  for diverticulitis and has noted some improvement in abdominal pain.  She is afebrile.  She has been taking Gemtesa  for LUTS but is concerned this could be contributing to abdominal pain, constipation.  She elected to stop taking this medication.  Patient also has history of statin intolerance and is on Repatha  twice a month.  She is concerned Repatha  could be contributing to some of her GI symptoms.  She is interested in not continuing Repatha .  Since ED visit overall patient is feeling better with remanent lower abdominal discomfort.  She is also taking famotidine  40 mg nightly and is no longer taking Prilosec 40 mg at night.   ROS As per HPI    Objective:    BP 126/82   Pulse 82   Temp 98.4 F (36.9 C)   Ht 5' 8 (1.727 m)   Wt 192 lb (87.1 kg)   SpO2 98%   BMI 29.19 kg/m    Physical Exam Constitutional:      General: She is not in acute distress.    Appearance: She is normal weight.  HENT:     Head: Normocephalic and atraumatic.     Mouth/Throat:     Mouth: Mucous membranes are moist.  Cardiovascular:     Rate and Rhythm: Normal rate.  Pulmonary:     Breath sounds: No  wheezing.  Abdominal:     General: Bowel sounds are normal.     Palpations: Abdomen is soft.     Tenderness: There is no abdominal tenderness. There is no guarding or rebound.  Musculoskeletal:     Right lower leg: No edema.     Left lower leg: No edema.  Skin:    General: Skin is warm.  Neurological:     Mental Status: She is alert and oriented to person, place, and time.  Psychiatric:        Mood and Affect: Mood normal.     No results found for any visits on 06/26/24.     Assessment & Plan:  Elevated serum creatinine Assessment & Plan: Component     Latest Ref Rng 06/22/2024  Sodium     135 - 145 mmol/L 141   Potassium     3.5 - 5.1 mmol/L 4.0   Chloride     98 - 111 mmol/L 109   CO2     22 - 32 mmol/L 22   Glucose     70 - 99 mg/dL 850 (H)   BUN     8 - 23 mg/dL 11   Creatinine     9.55 - 1.00 mg/dL 8.85 (H)  Calcium      8.9 - 10.3 mg/dL 9.2   Total Protein     6.5 - 8.1 g/dL 7.3   Albumin     3.5 - 5.0 g/dL 3.8   AST     15 - 41 U/L 20   ALT     0 - 44 U/L 23   Alkaline Phosphatase     38 - 126 U/L 79   Total Bilirubin     0.0 - 1.2 mg/dL 0.6   GFR, Estimated     >60 mL/min 53 (L)   Anion gap     5 - 15  10     Elevated creatinine, reduced GFR, elevated nonfasting blood glucose from baseline when she was seen in the ED.  This is likely related to reduced oral intake, diverticulitis.  We will repeat CMP today.  Patient has a history of prediabetes we discussed lifestyle modifications and repeat A1c in 2 months.  Orders: -     Comprehensive metabolic panel with GFR  Diverticulitis Assessment & Plan: - CT abdomen pelvis from 06/22/24 done in the ED: scattered colonic diverticula. Stranding adjacent 2 8 mid sigmoid colon diverticulum compatible with early active diverticulitis. - Since ED discharge patient has seen gastroenterologist Dr. Therisa and she is currently on Augmentin .  - Vitals normal, abdominal exam reassuring, mild abdominal discomfort  likely multifactorial including antibiotic use, recent diagnosis of diverticulitis.  Recommend patient finish antibiotic dose.  Will repeat CMP today.  Patient did notice constipation when she was on Gemtesa  and has noted improvement in stool consistency since stopping Gemtesa .  She has upcoming appointment with urologist for further evaluation.     Mixed hyperlipidemia Assessment & Plan: Patient's cholesterol has been stable since starting Repatha  twice a month.  Patient is concerned her GI symptoms are related to Repatha .  She will hold Repatha  for 1 month till she is evaluated by cardiology during upcoming appointment on 07/30/2024.  If she does not get repeat lipid panel during her cardiology appointment I recommend patient reach out to our clinic and we will have repeat lipid panel.   Gastroesophageal reflux disease, unspecified whether esophagitis present Assessment & Plan: She is not taking omeprazole . She is on famotidine 40 mg at nighttime.  She has only taken this for couple of days and has not noticed significant improvement in her symptoms.  She will continue to take this medication follow-up with GI as well.   Urinary bladder disorder Assessment & Plan: She was previously taking Gemtesa  but has stopped taking this due to concerns related to side effect including urinary retention, increased risk of urinary tract infection, constipation.  She plans on following up with urology and has an upcoming appointment with them.     Return if symptoms worsen or fail to improve.  Luke Shade, MD

## 2024-06-26 NOTE — Telephone Encounter (Signed)
-----   Message from Berwyn MATSU sent at 06/26/2024  9:03 AM EDT ----- Regarding: palpitation Pt call and I schedule her an appointment, but she is having palpitation increase.  thanks

## 2024-06-27 ENCOUNTER — Ambulatory Visit: Payer: Self-pay

## 2024-06-27 LAB — COMPREHENSIVE METABOLIC PANEL WITH GFR
ALT: 23 U/L (ref 0–35)
AST: 17 U/L (ref 0–37)
Albumin: 4.3 g/dL (ref 3.5–5.2)
Alkaline Phosphatase: 86 U/L (ref 39–117)
BUN: 11 mg/dL (ref 6–23)
CO2: 27 meq/L (ref 19–32)
Calcium: 9.7 mg/dL (ref 8.4–10.5)
Chloride: 104 meq/L (ref 96–112)
Creatinine, Ser: 0.86 mg/dL (ref 0.40–1.20)
GFR: 70.62 mL/min (ref >60.00)
Glucose, Bld: 97 mg/dL (ref 70–99)
Potassium: 4.1 meq/L (ref 3.5–5.1)
Sodium: 141 meq/L (ref 135–145)
Total Bilirubin: 0.4 mg/dL (ref 0.2–1.2)
Total Protein: 7.1 g/dL (ref 6.0–8.3)

## 2024-07-13 NOTE — Progress Notes (Deleted)
 Cardiology Clinic Note   Date: 07/13/2024 ID: Rya Rausch, DOB 1956/01/22, MRN [id number]  [facility] Providers Cardiologist:  None Electrophysiologist:  OLE ONEIDA HOLTS, MD   Chief Complaint   Christine Fitzpatrick is a 68 y.o. female who presents to the clinic today for ***  Patient Profile   Christine Fitzpatrick is followed by Dr. HOLTS for the history outlined below.      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 GEN change out 01/25/2021 performed at [facility]. Remote device check 05/13/2024: Normal device function.  V sensed.  Battery and lead parameters stable with stable capture and sensing.  Device programming appropriate.  No sustained arrhythmias.  3 runs of NSVT.  35 runs of SVT. SVT. EP study 07/18/2022: No inducible tachycardia.  AV conduction appears normal and excellent. 14-day Zio 07/11/2023: HR 59 to 218 bpm, average 84 bpm.  3 runs of NSVT longest 4 beats.  7 runs of PSVT longest 16 beats.  Rare PACs.  Occasional PVCs 2%.  Patient triggered events corresponded with sinus rhythm with PVC/PAC.  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  She was placed on the monitor and found to be in V-fib.  She was shocked and pulses returned.  She was intubated and brought to the ED.  She had a prior history of palpitations.  Cardiac catheterization demonstrated no significant coronary artery disease.  She underwent ICD implantation during hospital admission.  She underwent generator change out in March 2022.  Patient establish care with Dr. HOLTS on 05/18/2021.  She reported a history of SVT in the past.  She had been on several medications including metoprolol , bisoprolol, flecainide.  She tolerated metoprolol  for a long period of time but it eventually caused asymptomatic lower blood pressures and was subsequently  discontinued.  At the time of her visit she was taking flecainide once a day.  Decision was made to stop flecainide and start low-dose Toprol .  She underwent EP study in September 2023 with no inducible tachycardia.  She was continued on Toprol .  In August 2024 she was noted to have increased SVT burden on ICD remote reports.  Beta-blocker was increased.  14-day ZIO demonstrated 7 runs of PSVT.  Upon follow-up in September 2024 she reported continued brief episodes of palpitations but improved since increase in Toprol .  Patient was last seen in the office by Chantal Needle, NP on 01/28/2024 for routine follow-up.  She continued to have occasional palpitations.  She reported recent GI issues for which she was treated with ginger tea and felt it could be the culprit for increased palpitations.  No medication changes were made.  Patient contacted the office on 06/26/2024 with reports of increased palpitations.     History of Present Illness    Today, patient ***  VT arrest S/p ICD implantation May 2013 performed in [place] .  GEN change en route March 2022.  Remote device check July 2025 showed normal device function, V sensed, battery and lead parameters stable, no sustained arrhythmias.  Patient denies being shocked by ICD*** - Continue Toprol . - Continue to follow with EP.  Palpitations/PSVT EP study September 2023 with no inducible tachycardia.  14-day ZIO September 2024 demonstrated HR 59 to 218 bpm, average 84 bpm, 3 runs of NSVT longest 4 beats, several runs of PSVT longest 16 beats.  Patient*** - Continue Toprol .  ROS: All  other systems reviewed and are otherwise negative except as noted in History of Present Illness.  EKGs/Labs Reviewed        06/26/2024: ALT 23; AST 17; BUN 11; Creatinine, Ser 0.86; Potassium 4.1; Sodium 141   06/22/2024: Hemoglobin 13.7; WBC 9.6   12/20/2023: TSH 1.62   No results found for requested labs within last 365 days.  ***  Risk Assessment/Calculations     {Does this patient have ATRIAL FIBRILLATION?:[phone]} No BP recorded.  {Refresh Note OR Click here to enter BP  :1}***        Physical Exam    VS:  There were no vitals taken for this visit. , BMI There is no height or weight on file to calculate BMI.  GEN: Well nourished, well developed, in no acute distress. Neck: No JVD or carotid bruits. Cardiac: *** RRR. *** No murmur. No rubs or gallops.   Respiratory:  Respirations regular and unlabored. Clear to auscultation without rales, wheezing or rhonchi. GI: Soft, nontender, nondistended. Extremities: Radials/DP/PT 2+ and equal bilaterally. No clubbing or cyanosis. No edema ***  Skin: Warm and dry, no rash. Neuro: Strength intact.  Assessment & Plan   ***  Disposition: ***     {Are you ordering a CV Procedure (e.g. stress test, cath, DCCV, TEE, etc)?   Press F2        :[id number]}   Signed, Barnie HERO. Tanielle Emigh, DNP, NP-C

## 2024-07-15 ENCOUNTER — Ambulatory Visit: Admitting: Student

## 2024-07-15 ENCOUNTER — Ambulatory Visit

## 2024-07-16 ENCOUNTER — Ambulatory Visit (INDEPENDENT_AMBULATORY_CARE_PROVIDER_SITE_OTHER): Admitting: Physician Assistant

## 2024-07-16 VITALS — BP 111/72 | HR 83 | Ht 68.0 in | Wt 192.0 lb

## 2024-07-16 DIAGNOSIS — R102 Pelvic and perineal pain: Secondary | ICD-10-CM

## 2024-07-16 LAB — URINALYSIS, COMPLETE
Bilirubin, UA: NEGATIVE
Glucose, UA: NEGATIVE
Ketones, UA: NEGATIVE
Leukocytes,UA: NEGATIVE
Nitrite, UA: NEGATIVE
Protein,UA: NEGATIVE
RBC, UA: NEGATIVE
Specific Gravity, UA: 1.01 (ref 1.005–1.030)
Urobilinogen, Ur: 0.2 mg/dL (ref 0.2–1.0)
pH, UA: 6 (ref 5.0–7.5)

## 2024-07-16 LAB — MICROSCOPIC EXAMINATION: Epithelial Cells (non renal): >10 /HPF — AB (ref 0–10)

## 2024-07-16 NOTE — Patient Instructions (Signed)
Follow Up As Needed 

## 2024-07-16 NOTE — Progress Notes (Signed)
 07/16/2024 10:07 AM   Christine Fitzpatrick Oct 23, 1956 [id number]  CC: Chief Complaint  Patient presents with   Medical Management of Chronic Issues   HPI: Christine Fitzpatrick is a 68 y.o. female with PMH GSM on topical vaginal estrogen cream and LUTS with right sided pelvic pain who presents today for symptom recheck.   Her recent health has been notable for C. difficile and acute diverticulitis.  Today she reports she has continued to have sharp right pelvic discomfort with voiding, though feels it has been improving this week.  Another provider told her that her symptoms could be a UTI and she saw that Gemtesa  can cause those, so she stopped it.  She remains on estrogen cream 2-3 times weekly and finds that it does offer her some immediate relief when she uses it.  CTAP with contrast on 06/22/2024 at the time of diverticulitis diagnosis had no other significant urologic findings.  In-office UA today pan negative; urine microscopy with >10 epithelial/hpf.   PMH: Past Medical History:  Diagnosis Date   Annual physical exam 11/21/2021   Aspiration pneumonia ([name]) 03/05/2012   Axillary lymphadenopathy 09/06/2017   Breast cancer screening by mammogram 04/08/2023   C. difficile diarrhea    Cardiac arrest ([name]) 02/29/2012   Cervical cancer screening 04/08/2023   Hyperlipidemia    Hypertension    Sudden cardiac death due to cardiac arrhythmia 03/29/2011    Surgical History: Past Surgical History:  Procedure Laterality Date   BREAST EXCISIONAL BIOPSY     CARDIAC DEFIBRILLATOR PLACEMENT     CATARACT EXTRACTION Left    COLONOSCOPY WITH PROPOFOL  N/A 06/20/2023   Procedure: COLONOSCOPY WITH PROPOFOL ;  Surgeon: Therisa Bi, MD;  Location: [facility] ENDOSCOPY;  Service: Gastroenterology;  Laterality: N/A;   MIDDLE EAR SURGERY Left    POLYPECTOMY  06/20/2023   Procedure: POLYPECTOMY;  Surgeon: Therisa Bi, MD;  Location: [facility] ENDOSCOPY;  Service: Gastroenterology;;   SVT ABLATION N/A 07/18/2022    Procedure: SVT ABLATION;  Surgeon: Cindie Ole DASEN, MD;  Location: [facility] INVASIVE CV LAB;  Service: Cardiovascular;  Laterality: N/A;    Home Medications:  Allergies as of 07/16/2024       Reactions   Misc. Sulfonamide Containing Compounds Hives   Sulfa Antibiotics Hives, Itching, Rash, Dermatitis   Codeine Hives, Itching, Rash   Morphine And Codeine Hives, Itching, Rash   Sulfamethoxazole-trimethoprim Rash        Medication List        Accurate as of July 16, 2024 10:07 AM. If you have any questions, ask your nurse or doctor.          acetaminophen  500 MG tablet Commonly known as: TYLENOL  Take 500-1,000 mg by mouth every 6 (six) hours as needed (pain.).   azelastine 0.1 % nasal spray Commonly known as: ASTELIN Place 2 sprays into both nostrils in the morning. Use in each nostril as directed What changed:  when to take this reasons to take this   B-complex with vitamin C tablet Take 1 tablet by mouth daily in the afternoon. What changed: when to take this   calcium  carbonate 1250 (500 Ca) MG chewable tablet Commonly known as: OS-CAL Chew 1 tablet by mouth daily.   cetirizine 10 MG tablet Commonly known as: ZYRTEC Take 10 mg by mouth daily as needed for allergies.   estradiol  0.1 MG/GM vaginal cream Commonly known as: ESTRACE  Discard applicator Apply pea sized amount to tip of finger to urethra before bed. Wash hands well after application. Use  Monday, Wednesday and Friday   MAGNESIUM PO Take by mouth daily. Over the counter take by mouth daily   methylPREDNISolone 4 MG Tbpk tablet Commonly known as: MEDROL DOSEPAK Take 4 mg by mouth. What changed:  when to take this reasons to take this   metoprolol  succinate 25 MG 24 hr tablet Commonly known as: TOPROL -XL Take 25 mg (1 tablet) in the morning, and 25 (1 tablet) mg in the evening.   ondansetron  4 MG disintegrating tablet Commonly known as: ZOFRAN -ODT Take 1 tablet (4 mg total) by mouth every 8  (eight) hours as needed for nausea or vomiting.   Pepcid 40 MG tablet Generic drug: famotidine What changed: See the new instructions.   Repatha  140 MG/ML Sosy Generic drug: Evolocumab  INJECT 1 SYRINGE SUBCUTANEOUSLY  EVERY 2 WEEKS        Allergies:  Allergies  Allergen Reactions   Misc. Sulfonamide Containing Compounds Hives   Sulfa Antibiotics Hives, Itching, Rash and Dermatitis   Codeine Hives, Itching and Rash   Morphine And Codeine Hives, Itching and Rash   Sulfamethoxazole-Trimethoprim Rash    Family History: Family History  Problem Relation Age of Onset   Hypertension Mother    Hypertension Maternal Aunt    Diabetes Maternal Grandmother    Breast cancer Neg Hx     Social History:   reports that she has never smoked. She has never used smokeless tobacco. She reports that she does not currently use alcohol. She reports that she does not use drugs.  Physical Exam: BP 111/72   Pulse 83   Ht 5' 8 (1.727 m)   Wt 192 lb (87.1 kg)   BMI 29.19 kg/m   Constitutional:  Alert and oriented, no acute distress, nontoxic appearing HEENT: [place], AT Cardiovascular: No clubbing, cyanosis, or edema Respiratory: Normal respiratory effort, no increased work of breathing Skin: No rashes, bruises or suspicious lesions Neurologic: Grossly intact, no focal deficits, moving all 4 extremities Psychiatric: Normal mood and affect  Laboratory Data: Results for orders placed or performed in visit on 07/16/24  Microscopic Examination   Collection Time: 07/16/24  9:49 AM   Urine  Result Value Ref Range   WBC, UA 0-5 0 - 5 /hpf   RBC, Urine 0-2 0 - 2 /hpf   Epithelial Cells (non renal) >10 (A) 0 - 10 /hpf   Bacteria, UA Few None seen/Few  Urinalysis, Complete   Collection Time: 07/16/24  9:49 AM  Result Value Ref Range   Specific Gravity, UA 1.010 1.005 - 1.030   pH, UA 6.0 5.0 - 7.5   Color, UA Yellow Yellow   Appearance Ur Clear Clear   Leukocytes,UA Negative Negative    Protein,UA Negative Negative/Trace   Glucose, UA Negative Negative   Ketones, UA Negative Negative   RBC, UA Negative Negative   Bilirubin, UA Negative Negative   Urobilinogen, Ur 0.2 0.2 - 1.0 mg/dL   Nitrite, UA Negative Negative   Microscopic Examination Comment    Microscopic Examination See below:    Assessment & Plan:   1. Pelvic pain in female (Primary) Starting to improve as of this week on vaginal estrogen cream.  Continue estrogen cream 3 times weekly.  If her symptoms worsen again, we will pursue cystoscopy.  UA bland, low suspicion for UTI. - Urinalysis, Complete   Return if symptoms worsen or fail to improve.  Lucie Hones, PA-C  [facility] [place] [address] [place], [place] [address] [phone]

## 2024-07-24 ENCOUNTER — Encounter: Admitting: Cardiology

## 2024-07-29 NOTE — Progress Notes (Unsigned)
 Electrophysiology Clinic Note    Date:  07/30/2024  Patient ID:  Christine Fitzpatrick, Christine Fitzpatrick 11, 1957, MRN [id number] PCP:  Abbey Bruckner, MD  Cardiologist:  None   Electrophysiologist:  OLE ONEIDA HOLTS, MD  Electrophysiology APP:  Juelz Claar, NP   Discussed the use of AI scribe software for clinical note transcription with the patient, who gave verbal consent to proceed.   Patient Profile    Chief Complaint: palpitation, ICD follow-up  History of Present Illness: Christine Fitzpatrick is a 68 y.o. female with PMH notable for SVT, cardiac arrest, VT s/p ICD, PVC, PAC ; seen today for OLE ONEIDA HOLTS, MD for routine electrophysiology followup.  She is s/p EP study 07/2022 where SVT could not be induced.   Historically, she is symptomatic of PVC and PACs, but quite symptomatic with SVT episodes with some improvement with adjusting toprol  to BID.   ON follow-up today, she was having significant increase in SVT that corresponded to being diagnosed with diverticulitis and c. Diff. Has seen GI and with treatment her palpitation episodes have also improved. During her most symptomatic days, she self-increased toprol  to 25mg  in AM, 50mg  in PM which did help but resulted in increased dizziness. She has since reduced to 25mg  BID with improvement in dizziness.   She is currently taking a steroid dosepak for her back pain. She continues to drink caffeinated green tea.  She denies chest pain, chest pressure, palpitations currently. No syncope.       Arrhythmia/Device History MDT single chamber ICD, imp 03/2012; dx Cardiac arrest  - gen change 2022  AAD - Flec previously    ROS:  Please see the history of present illness. All other systems are reviewed and otherwise negative.    Physical Exam    VS:  BP 106/70 (BP Location: Left Arm, Patient Position: Sitting, Cuff Size: Normal)   Pulse 93   Ht 5' 8 (1.727 m)   Wt 194 lb 6.4 oz (88.2 kg)   SpO2 97%   BMI 29.56 kg/m  BMI: Body mass  index is 29.56 kg/m.      Wt Readings from Last 3 Encounters:  07/30/24 194 lb 6.4 oz (88.2 kg)  07/16/24 192 lb (87.1 kg)  06/26/24 192 lb (87.1 kg)     GEN- The patient is well appearing, alert and oriented x 3 today.   Lungs- Clear to ausculation bilaterally, normal work of breathing.  Heart- Regular rate and rhythm, no murmurs, rubs or gallops Extremities- No peripheral edema, warm, dry Skin-  device pocket well-healed, no tethering   Device interrogation done today and reviewed by myself:  Battery 10.6 years Lead thresholds, impedence, sensing stable  VP < 1% Several SVT episodes, 1 single NSVT episode No sustained ventricular arrhythmias No changes made today   Studies Reviewed   Previous EP, cardiology notes.    EKG is ordered. Personal review of EKG from today shows:    EKG Interpretation Date/Time:  Wednesday July 30 2024 10:01:15 EDT Ventricular Rate:  93 PR Interval:  154 QRS Duration:  86 QT Interval:  356 QTC Calculation: 442 R Axis:   12  Text Interpretation: Normal sinus rhythm Normal ECG Confirmed by Aluna Whiston [phone]) on 07/30/2024 10:06:04 AM    Long term monitor, 07/11/2023 HR 59 - 218, average 84 bpm. 3 NSVT, longest 4 beats. 7 nonsustained SVT, longest 16 beats. Rare supraventricular ectopy. Occasional ventricular ectopy, 2%. No sustained arrhythmias. Symptom trigger episodes correspond to sinus rhythm with PVC/PAC.  30 day Ambulatory monitor, 08/29/2022 HR 62 - 157, average 85 bpm. No atrial fibrillation detected. Occasional ventricular ectopy, 2% Rare supraventricular ectopy, <1%.   Ambulatory monitor, 06/07/2021 HR 57 - 160bpm, average 83 bpm. 5 SVT, longest lasting 4 beats.  Rare supraventricular ectopy. 1.2% PVC burden. No sustained arrhythmias.  TTE, 04/09/2019 Summary --- Estimated LVEF 60%   Normal LV size and function.   Mild mitral regurgitation.   Mild tricuspid regurgitation with normal PAP.   Defibrillator wire  is visualized within the right heart.    Assessment and Plan     #) SVT #) PAC #) PVC #) palpitations Continues to have frequent palpitation episodes, worsened iso GI pathologies. Her palpitations have improved with treatment Continue 25mg  toprol  BID  #) cardiac arrest s/p ICD Very brief, rare NSVT episodes Low VP No sustained ventricular arrhythmias Lead measurements stable        Current medicines are reviewed at length with the patient today.   The patient does not have concerns regarding her medicines.  The following changes were made today:  none  Labs/ tests ordered today include:  Orders Placed This Encounter  Procedures   EKG 12-Lead     Disposition: Follow up with Dr. Cindie or EP APP in 12 months   Signed, Chantal Needle, NP  07/30/24  10:49 AM  Electrophysiology [facility]

## 2024-07-30 ENCOUNTER — Ambulatory Visit: Attending: Cardiology | Admitting: Cardiology

## 2024-07-30 ENCOUNTER — Ambulatory Visit: Payer: Self-pay | Admitting: Cardiology

## 2024-07-30 ENCOUNTER — Encounter: Payer: Self-pay | Admitting: Cardiology

## 2024-07-30 VITALS — BP 106/70 | HR 93 | Ht 68.0 in | Wt 194.4 lb

## 2024-07-30 DIAGNOSIS — I471 Supraventricular tachycardia, unspecified: Secondary | ICD-10-CM | POA: Diagnosis present

## 2024-07-30 DIAGNOSIS — R002 Palpitations: Secondary | ICD-10-CM | POA: Insufficient documentation

## 2024-07-30 DIAGNOSIS — I469 Cardiac arrest, cause unspecified: Secondary | ICD-10-CM | POA: Insufficient documentation

## 2024-07-30 DIAGNOSIS — Z95810 Presence of automatic (implantable) cardiac defibrillator: Secondary | ICD-10-CM | POA: Insufficient documentation

## 2024-07-30 LAB — CUP PACEART INCLINIC DEVICE CHECK
Date Time Interrogation Session: 20250924123417
Implantable Lead Connection Status: 753985
Implantable Lead Implant Date: 20130501
Implantable Lead Location: 753860
Implantable Pulse Generator Implant Date: 20220322

## 2024-07-30 NOTE — Patient Instructions (Signed)
 Medication Instructions:  Your physician recommends that you continue on your current medications as directed. Please refer to the Current Medication list given to you today.    *If you need a refill on your cardiac medications before your next appointment, please call your pharmacy*  Lab Work: No labs ordered today    Testing/Procedures: No test ordered today   Follow-Up: At [facility], you and your health needs are our priority.  As part of our continuing mission to provide you with exceptional heart care, our providers are all part of one team.  This team includes your primary Cardiologist (physician) and Advanced Practice Providers or APPs (Physician Assistants and Nurse Practitioners) who all work together to provide you with the care you need, when you need it.  Your next appointment:   1 year(s)  Provider:   Ole Holts, MD or Suzann Riddle, NP

## 2024-08-06 ENCOUNTER — Ambulatory Visit (INDEPENDENT_AMBULATORY_CARE_PROVIDER_SITE_OTHER)

## 2024-08-06 ENCOUNTER — Other Ambulatory Visit: Payer: Self-pay

## 2024-08-06 VITALS — BP 98/80 | HR 89 | Temp 98.6°F | Ht 68.0 in | Wt 195.8 lb

## 2024-08-06 DIAGNOSIS — I471 Supraventricular tachycardia, unspecified: Secondary | ICD-10-CM

## 2024-08-06 DIAGNOSIS — Z789 Other specified health status: Secondary | ICD-10-CM

## 2024-08-06 DIAGNOSIS — E663 Overweight: Secondary | ICD-10-CM | POA: Insufficient documentation

## 2024-08-06 DIAGNOSIS — N952 Postmenopausal atrophic vaginitis: Secondary | ICD-10-CM

## 2024-08-06 DIAGNOSIS — E782 Mixed hyperlipidemia: Secondary | ICD-10-CM | POA: Diagnosis not present

## 2024-08-06 DIAGNOSIS — R7303 Prediabetes: Secondary | ICD-10-CM | POA: Diagnosis not present

## 2024-08-06 MED ORDER — TIRZEPATIDE-WEIGHT MANAGEMENT 2.5 MG/0.5ML ~~LOC~~ SOLN
2.5000 mg | SUBCUTANEOUS | 0 refills | Status: DC
  Filled 2024-08-06: qty 6, 84d supply, fill #0
  Filled ????-??-??: fill #0

## 2024-08-06 MED ORDER — TIRZEPATIDE-WEIGHT MANAGEMENT 2.5 MG/0.5ML ~~LOC~~ SOLN: 2.5000 mg | SUBCUTANEOUS | 0 refills | Status: DC

## 2024-08-06 NOTE — Assessment & Plan Note (Signed)
 Established with urology, on vaginal estrogen cream three times a week. Continue f/u with urology.

## 2024-08-06 NOTE — Progress Notes (Signed)
 C  Established Patient Office Visit   Subjective  Patient ID: Christine Fitzpatrick, female    DOB: Feb 16, 1956  Age: 68 y.o. MRN: [id number]  Chief Complaint  Patient presents with   Weight Management Screening    She  has a past medical history of Allergy, Annual physical exam (11/21/2021), Arthritis, Aspiration pneumonia ([name]) (03/05/2012), Axillary lymphadenopathy (09/06/2017), Breast cancer screening by mammogram (04/08/2023), C. 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  HPI Discussed the use of AI scribe software for clinical note transcription with the patient, who gave verbal consent to proceed.  History of Present Illness Christine Fitzpatrick is a 68 year old female who presents for follow-up on her medication regimen and symptom management.  - Hyperlipidemia: She has a history of hyperlipidemia and was previously on Repatha . Her joint pain has improved since discontinuing Repatha , which she stopped after her cardiology visit was canceled. She has been using an over-the-counter cholesterol supplement and notes a decrease in joint pain and rhinorrhea since starting it.  - Polyarthritis: She continues to experience back and hip pain. Was seen at [facility] on 07/21/24 for lumbar trans-foraminal epidural injection. Also received injection to her hip through them. Since stopping Repatha , has felt better in regards to back pain.   -  Saw urology on 07/16/24 Lucie Hones, Trial of vaginal estrogen cream three times a week for vaginal irritation, urinary symptoms.  Plan to consider cystoscopy if symptoms persists. She has been using a vaginal cream three times weekly for urological symptoms and is monitoring for improvement.   - Co morbidities, cardiac: She has a h/o  SVT, PAC, PVC,  palpitations with cardiac arrest, s/p icd. She saw cardiology on 07/30/24 and is recommended to stay on Toprol  25 mg BID. She took it this morning.  She is also taking an over-the-counter cholesterol supplement.  - She is interested in weight loss pharmacological intervention. She has tried lifestyle modifications including regular exercise, healthy eating and has not been able to achieve weight loss. She is hopeful with pharmacological intervention she may notice improvement in back pain, hip pain. Denies personal or family history of medullary thyroid  carcinoma, multiple endocrine neoplasia syndrome type 2,  IBD, pancreatitis, gastroparesis.   ROS As per HPI    Objective:     BP 98/80 (BP Location: Right Arm, Patient Position: Sitting, Cuff Size: Normal)   Pulse 89   Temp 98.6 F (37 C) (Oral)   Ht 5' 8 (1.727 m)   Wt 195 lb 12.8 oz (88.8 kg)   SpO2 95%   BMI 29.77 kg/m      08/06/2024    8:12 AM 05/13/2024    4:13 PM 03/07/2024    8:37 AM  Depression screen PHQ 2/9  Decreased Interest 0 0 0  Down, Depressed, Hopeless 0 0 0  PHQ - 2 Score 0 0 0  Altered sleeping 0 0 1  Tired, decreased energy 0 0 0  Change in appetite 0 0 1  Feeling bad or failure about yourself  0 0 0  Trouble concentrating 0 0 0  Moving slowly or fidgety/restless 0 0 0  Suicidal thoughts 0 0 0  PHQ-9 Score 0 0 2  Difficult doing work/chores Not difficult at all Not difficult at all Not difficult at all      08/06/2024    8:12 AM 05/13/2024    4:14  PM 03/07/2024    8:37 AM 12/20/2023    8:47 AM  GAD 7 : Generalized Anxiety Score  Nervous, Anxious, on Edge 0 0 0 0  Control/stop worrying 0 0 0 0  Worry too much - different things 0 0 0 0  Trouble relaxing 0 0 0 0  Restless 0 0 0 0  Easily annoyed or irritable 0 0 0 0  Afraid - awful might happen 0 0 0 0  Total GAD 7 Score 0 0 0 0  Anxiety Difficulty Not difficult at all Not difficult at all Not difficult at all Not difficult at all      08/06/2024     8:12 AM 05/13/2024    4:13 PM 03/07/2024    8:37 AM  Depression screen PHQ 2/9  Decreased Interest 0 0 0  Down, Depressed, Hopeless 0 0 0  PHQ - 2 Score 0 0 0  Altered sleeping 0 0 1  Tired, decreased energy 0 0 0  Change in appetite 0 0 1  Feeling bad or failure about yourself  0 0 0  Trouble concentrating 0 0 0  Moving slowly or fidgety/restless 0 0 0  Suicidal thoughts 0 0 0  PHQ-9 Score 0 0 2  Difficult doing work/chores Not difficult at all Not difficult at all Not difficult at all      08/06/2024    8:12 AM 05/13/2024    4:14 PM 03/07/2024    8:37 AM 12/20/2023    8:47 AM  GAD 7 : Generalized Anxiety Score  Nervous, Anxious, on Edge 0 0 0 0  Control/stop worrying 0 0 0 0  Worry too much - different things 0 0 0 0  Trouble relaxing 0 0 0 0  Restless 0 0 0 0  Easily annoyed or irritable 0 0 0 0  Afraid - awful might happen 0 0 0 0  Total GAD 7 Score 0 0 0 0  Anxiety Difficulty Not difficult at all Not difficult at all Not difficult at all Not difficult at all   SDOH Screenings   Food Insecurity: No Food Insecurity (06/11/2024)   [facility]  Housing: Low Risk  (06/11/2024)   Received from [facility]  Transportation Needs: No Transportation Needs (06/11/2024)   Received from [facility]  Utilities: Not At Risk (06/11/2024)   Received from [facility]  Alcohol Screen: Low Risk  (03/07/2024)  Depression (PHQ2-9): Low Risk  (08/06/2024)  Financial Resource Strain: Low Risk  (06/11/2024)   Received from [facility]  Physical Activity: Insufficiently Active (05/10/2024)  Social Connections: Socially Integrated (05/10/2024)  Stress: No Stress Concern Present (05/10/2024)  Tobacco Use: Low Risk  (08/06/2024)  Health Literacy: Adequate Health Literacy (03/07/2024)     Physical Exam Constitutional:      General: She is not in acute distress.    Appearance: She is obese. She is not  ill-appearing.  HENT:     Head: Normocephalic and atraumatic.     Right Ear: External ear normal.     Left Ear: Tympanic membrane normal.     Ears:     Comments: Left ear: tympanostomy tube in place noted    Mouth/Throat:     Mouth: Mucous membranes are moist.  Cardiovascular:     Rate and Rhythm: Normal rate.  Pulmonary:     Effort: Pulmonary effort is normal.     Breath sounds: Normal breath sounds. No  wheezing.  Abdominal:     General: Abdomen is protuberant.     Palpations: Abdomen is soft.     Tenderness: There is no guarding.  Musculoskeletal:     Cervical back: Normal range of motion.     Right lower leg: No edema.     Left lower leg: No edema.  Lymphadenopathy:     Cervical: No cervical adenopathy.  Skin:    General: Skin is warm.  Neurological:     Mental Status: She is alert and oriented to person, place, and time.  Psychiatric:        Mood and Affect: Mood normal.        No results found for any visits on 08/06/24.  The ASCVD Risk score (Arnett DK, et al., 2019) failed to calculate for the following reasons:   Unable to determine if patient is Non-Hispanic African American     Assessment & Plan:   Overweight (BMI 25.0-29.9) Assessment & Plan: Overweight with comorbidities (prediabetes, mixed hyperlipidemia, h/o cardiac arrest, SVT, polyarthritis) Discussed GLP-1, GIP/GLP-1 receptor agonists for weight management in adjunct with continuing extensive lifestyle modifications including 30 min of moderate intensity exercise daily or at least 150 min per week, healthy diet, calory deficit.  Explained potential side effects and contraindications.  No personal or family history of medullary thyroid  carcinoma,  multiple endocrine neoplasia syndrome type 2,  IBD, pancreatitis, gastroparesis.  Start Zepbound 2.5 mg once weekly for 4-6 weeks. Schedule virtual follow-up in 6-8 weeks to assess tolerance and adjust dosage.  Prescription sent to the pharmacy.  Printed  information on [employer], zepbound provided to the patient. If s/e recommend she reach out to our clinic.   Orders: -     Tirzepatide-Weight Management; Inject 2.5 mg into the skin once a week.  Dispense: 6 mL; Refill: 0  Prediabetes Assessment & Plan: A1c from 05/16/24 in prediabetes range. Continue healthy eating, regular moderate intensity exercise for 30 days each week. Trial of Zepbound 2.5 mg once weekly to help with overweight and co-morbidly/prediabetes. Repeat A1c in 3 months, future lab ordered.   Orders: -     Tirzepatide-Weight Management; Inject 2.5 mg into the skin once a week.  Dispense: 6 mL; Refill: 0 -     Hemoglobin A1c; Future  Mixed hyperlipidemia -     Tirzepatide-Weight Management; Inject 2.5 mg into the skin once a week.  Dispense: 6 mL; Refill: 0 -     Lipid panel; Future  Supraventricular tachycardia Assessment & Plan: Patient asymptomatic today, no new concerns. Continue Metoprolol  succinate 25 mg BID.   Orders: -     Tirzepatide-Weight Management; Inject 2.5 mg into the skin once a week.  Dispense: 6 mL; Refill: 0  Statin intolerance Assessment & Plan: Patient with h/o worsening arthritis, myalgias on Crestor  and Lipitor. Holding Repatha  as she has noted improved back pain, hip pain, rhinitis off Repatha . Recommend starting Zepbound to improve lipid panel as well.   Orders: -     Tirzepatide-Weight Management; Inject 2.5 mg into the skin once a week.  Dispense: 6 mL; Refill: 0  Post-menopausal atrophic vaginitis Assessment & Plan: Established with urology, on vaginal estrogen cream three times a week. Continue f/u with urology.      Return in about 6 weeks (around 09/17/2024).   Luke Shade, MD

## 2024-08-06 NOTE — Assessment & Plan Note (Signed)
 Patient asymptomatic today, no new concerns. Continue Metoprolol  succinate 25 mg BID.

## 2024-08-06 NOTE — Patient Instructions (Addendum)
 GLP-1 (Semaglutide like Paton) and  Dual GIP/GLP-1 agonist (Tirzepitide like Zepbound)  are a type of injectable medicine that can help with weight loss. They work by helping you feel full sooner, reducing your appetite, and slowing down how quickly food leaves your stomach. They also help your body use insulin better, which can help control blood sugar.   How does it help with weight loss? GLP-1 medications help you eat less by making you feel full faster and for longer. This can make it easier to stick to a healthy eating plan and lose weight over time.  How is it taken? Most GLP-1 medications are given as a small injection under the skin, usually once a week or once a day, depending on the specific medication. The injection is done with a very small needle, and most people find it easy to do at home after some instruction.   What can you expect? You may notice you feel full sooner when eating. You might have less interest in snacking or eating large portions. Weight loss is gradual--most people lose weight steadily over several months. You may also see improvements in blood sugar, blood pressure, and cholesterol.  Possible side effects: The most common side effects are mild and include nausea, vomiting, diarrhea, or constipation. These usually get better as your body adjusts. Rarely, more serious side effects can occur, such as severe stomach pain or signs of an allergic reaction. If you have any unusual symptoms, let us  know right away.  [facility]:  [address]  [place] [address]  Near the intersection of: [address]

## 2024-08-06 NOTE — Assessment & Plan Note (Signed)
 History, no palpitations, on Metoprolol  succinate 25 mg BID, continue. Continue f/u with cardiology as scheduled.

## 2024-08-06 NOTE — Assessment & Plan Note (Signed)
 A1c from 05/16/24 in prediabetes range. Continue healthy eating, regular moderate intensity exercise for 30 days each week. Trial of Zepbound 2.5 mg once weekly to help with overweight and co-morbidly/prediabetes. Repeat A1c in 3 months, future lab ordered.

## 2024-08-06 NOTE — Assessment & Plan Note (Signed)
 Patient with h/o worsening arthritis, myalgias on Crestor  and Lipitor. Holding Repatha  as she has noted improved back pain, hip pain, rhinitis off Repatha . Recommend starting Zepbound to improve lipid panel as well.

## 2024-08-06 NOTE — Assessment & Plan Note (Signed)
 Overweight with comorbidities (prediabetes, mixed hyperlipidemia, h/o cardiac arrest, SVT, polyarthritis) Discussed GLP-1, GIP/GLP-1 receptor agonists for weight management in adjunct with continuing extensive lifestyle modifications including 30 min of moderate intensity exercise daily or at least 150 min per week, healthy diet, calory deficit.  Explained potential side effects and contraindications.  No personal or family history of medullary thyroid  carcinoma,  multiple endocrine neoplasia syndrome type 2,  IBD, pancreatitis, gastroparesis.  Start Zepbound 2.5 mg once weekly for 4-6 weeks. Schedule virtual follow-up in 6-8 weeks to assess tolerance and adjust dosage.  Prescription sent to the pharmacy.  Printed information on Wegov[employer], zepbound provided to the patient. If s/e recommend she reach out to our clinic.

## 2024-08-13 ENCOUNTER — Ambulatory Visit: Payer: Managed Care, Other (non HMO)

## 2024-08-13 DIAGNOSIS — I471 Supraventricular tachycardia, unspecified: Secondary | ICD-10-CM | POA: Diagnosis not present

## 2024-08-14 LAB — CUP PACEART REMOTE DEVICE CHECK
Battery Remaining Longevity: 127 mo
Battery Voltage: 3.02 V
Brady Statistic RA Percent Paced: INVALID
Brady Statistic RV Percent Paced: 0 %
Date Time Interrogation Session: 20251007223006
HighPow Impedance: 43 Ohm
HighPow Impedance: 51 Ohm
Implantable Lead Connection Status: 753985
Implantable Lead Implant Date: 20130501
Implantable Lead Location: 753860
Implantable Pulse Generator Implant Date: 20220322
Lead Channel Impedance Value: 513 Ohm
Lead Channel Impedance Value: 570 Ohm
Lead Channel Pacing Threshold Amplitude: 0.5 V
Lead Channel Pacing Threshold Pulse Width: 0.4 ms
Lead Channel Sensing Intrinsic Amplitude: 9.4 mV
Lead Channel Setting Pacing Amplitude: 1.5 V
Lead Channel Setting Pacing Pulse Width: 0.4 ms
Lead Channel Setting Sensing Sensitivity: 0.45 mV
Zone Setting Status: 755011

## 2024-08-15 NOTE — Progress Notes (Signed)
 Remote ICD Transmission

## 2024-08-18 ENCOUNTER — Ambulatory Visit: Payer: Self-pay | Admitting: Cardiology

## 2024-08-25 ENCOUNTER — Other Ambulatory Visit: Payer: Self-pay

## 2024-08-25 ENCOUNTER — Encounter: Payer: Self-pay | Admitting: Pharmacist

## 2024-08-25 DIAGNOSIS — R7303 Prediabetes: Secondary | ICD-10-CM

## 2024-08-25 DIAGNOSIS — E663 Overweight: Secondary | ICD-10-CM

## 2024-08-25 DIAGNOSIS — I471 Supraventricular tachycardia, unspecified: Secondary | ICD-10-CM

## 2024-08-25 DIAGNOSIS — E782 Mixed hyperlipidemia: Secondary | ICD-10-CM

## 2024-08-25 DIAGNOSIS — Z789 Other specified health status: Secondary | ICD-10-CM

## 2024-08-25 MED ORDER — TIRZEPATIDE-WEIGHT MANAGEMENT 2.5 MG/0.5ML ~~LOC~~ SOAJ
2.5000 mg | SUBCUTANEOUS | 0 refills | Status: DC
  Filled 2024-08-25: qty 2, 28d supply, fill #0

## 2024-09-09 ENCOUNTER — Telehealth: Payer: Self-pay

## 2024-09-09 ENCOUNTER — Other Ambulatory Visit (HOSPITAL_COMMUNITY): Payer: Self-pay

## 2024-09-09 NOTE — Telephone Encounter (Signed)
 PA request has been Submitted. New Encounter has been or will be created for follow up. For additional info see Pharmacy Prior Auth telephone encounter from 09/09/2024.

## 2024-09-09 NOTE — Telephone Encounter (Signed)
 Pharmacy Patient Advocate Encounter   Received notification from Physician's Office that prior authorization for Zepbound 2.5MG /0.5ML pen-injectors is required/requested.   Insurance verification completed.   The patient is insured through [facility].   Per test claim: PA required; PA submitted to above mentioned insurance via Latent Key/confirmation #/EOC [id number] Status is pending

## 2024-09-09 NOTE — Telephone Encounter (Signed)
 Noted

## 2024-09-09 NOTE — Telephone Encounter (Signed)
 MyChart message:  Appointment canceled for Alayha Babineaux ([id number]) Visit type: OFFICE VISIT 09/17/2024 8:00 AM (30 minutes) with Luke Shade in [facility]  Reason for cancellation: Canceled via MyChart  Patient comments: Dr. Shade wanted to see me 6 weeks after being on Zepbound. I called the [facility] pharmacy 3 weeks ago and they had not even placed request for authorization. I am still waiting for some type of answer  Appointment canceled (Newest Message First) View All Conversations on this Encounter Jiya Kissinger  P [facility] Admin12 hours ago (6:25 PM)   Appointment canceled for Keymoni Mccaster ([id number]) Visit type: OFFICE VISIT 09/17/2024 8:00 AM (30 minutes) with Luke Shade in [facility]   Reason for cancellation: Canceled via MyChart   Patient comments: Dr. Shade wanted to see me 6 weeks after being on Zepbound. I called the [facility] pharmacy 3 weeks ago and they had not even placed request for authorization. I am still waiting for some type of answer

## 2024-09-11 NOTE — Telephone Encounter (Signed)
 Pharmacy Patient Advocate Encounter  Received notification from [facility] that Prior Authorization for Zepbound 2.5MG /0.5ML pen-injectors  has been DENIED.  Full denial letter will be uploaded to the media tab. See denial reason below.  Zepbound may be covered when used for a medically-accepted indication. The Food and Drug Administration has approved Zepbound for the treatment of moderate to severe obstructive sleep apnea in adults with obesity. Any request outside this indication is an exclusion.  PA #/Case ID/Reference #: [id number]

## 2024-09-17 ENCOUNTER — Ambulatory Visit

## 2024-09-24 ENCOUNTER — Ambulatory Visit (INDEPENDENT_AMBULATORY_CARE_PROVIDER_SITE_OTHER): Admitting: Podiatry

## 2024-09-24 VITALS — Ht 68.0 in | Wt 195.8 lb

## 2024-09-24 DIAGNOSIS — L600 Ingrowing nail: Secondary | ICD-10-CM | POA: Diagnosis not present

## 2024-09-24 MED ORDER — NEOMYCIN-POLYMYXIN-HC 3.5-10000-1 OT SUSP: OTIC | 0 refills | Status: DC

## 2024-09-24 NOTE — Progress Notes (Unsigned)
  Subjective:  Patient ID: Christine Fitzpatrick, female    DOB: Feb 05, 1956,  MRN: [id number]  Chief Complaint  Patient presents with   Ingrown Toenail    RM 2 Patient is here for ingrown toe nail of the left and right hallux (lateral border). Pt states pain present for 1 month.    68 y.o. female presents with the above complaint. History confirmed with patient.   Objective:  Physical Exam: warm, good capillary refill, no trophic changes or ulcerative lesions, normal DP and PT pulses, normal sensory exam, and lateral border ingrowing right worse than left no paronychia.  Assessment:   1. Ingrowing left great toenail   2. Ingrowing right great toenail      Plan:  Patient was evaluated and treated and all questions answered.    Ingrown Nail, bilaterally -Patient elects to proceed with minor surgery to remove ingrown toenail today. Consent reviewed and signed by patient. -Ingrown nail excised. See procedure note. -Educated on post-procedure care including soaking. Written instructions provided and reviewed. -Rx for Cortisporin sent to pharmacy. -Advised on signs and symptoms of infection developing.  We discussed that the phenol likely will create some redness and edema and tenderness around the nailbed as long as it is localized this is to be expected.  Will return as needed if any infection signs develop  Procedure: Excision of Ingrown Toenail Location: Bilateral 1st toe lateral nail borders. Anesthesia: Lidocaine  1% plain; 1.5 mL and Marcaine  0.5% plain; 1.5 mL, digital block. Skin Prep: Betadine. Dressing: Silvadene; telfa; dry, sterile, compression dressing. Technique: Following skin prep, the toe was exsanguinated and a tourniquet was secured at the base of the toe. The affected nail border was freed, split with a nail splitter, and excised. Chemical matrixectomy was then performed with phenol and irrigated out with alcohol. The tourniquet was then removed and sterile dressing  applied. Disposition: Patient tolerated procedure well.    No follow-ups on file.

## 2024-09-24 NOTE — Patient Instructions (Signed)

## 2024-09-25 ENCOUNTER — Encounter: Payer: Self-pay | Admitting: Podiatry

## 2024-09-25 ENCOUNTER — Other Ambulatory Visit: Payer: Self-pay

## 2024-09-25 MED ORDER — METOPROLOL SUCCINATE ER 25 MG PO TB24: ORAL_TABLET | ORAL | 3 refills | Status: AC

## 2024-09-25 MED ORDER — NEOMYCIN-POLYMYXIN-HC 3.5-10000-1 OT SUSP: OTIC | 0 refills | Status: AC

## 2024-11-12 ENCOUNTER — Ambulatory Visit: Payer: Managed Care, Other (non HMO)

## 2024-11-12 ENCOUNTER — Other Ambulatory Visit: Payer: Self-pay | Admitting: Medical Genetics

## 2024-11-12 DIAGNOSIS — I471 Supraventricular tachycardia, unspecified: Secondary | ICD-10-CM

## 2024-11-13 ENCOUNTER — Other Ambulatory Visit (INDEPENDENT_AMBULATORY_CARE_PROVIDER_SITE_OTHER)

## 2024-11-13 ENCOUNTER — Other Ambulatory Visit

## 2024-11-13 DIAGNOSIS — E782 Mixed hyperlipidemia: Secondary | ICD-10-CM

## 2024-11-13 DIAGNOSIS — R7303 Prediabetes: Secondary | ICD-10-CM

## 2024-11-13 LAB — CUP PACEART REMOTE DEVICE CHECK
Battery Remaining Longevity: 124 mo
Battery Voltage: 3.02 V
Brady Statistic RV Percent Paced: 0.01 %
Date Time Interrogation Session: 20260107075459
HighPow Impedance: 43 Ohm
HighPow Impedance: 52 Ohm
Implantable Lead Connection Status: 753985
Implantable Lead Implant Date: 20130501
Implantable Lead Location: 753860
Implantable Pulse Generator Implant Date: 20220322
Lead Channel Impedance Value: 589 Ohm
Lead Channel Impedance Value: 608 Ohm
Lead Channel Pacing Threshold Amplitude: 0.5 V
Lead Channel Pacing Threshold Pulse Width: 0.4 ms
Lead Channel Sensing Intrinsic Amplitude: 7.6 mV
Lead Channel Setting Pacing Amplitude: 1.5 V
Lead Channel Setting Pacing Pulse Width: 0.4 ms
Lead Channel Setting Sensing Sensitivity: 0.45 mV
Zone Setting Status: 755011

## 2024-11-13 LAB — LIPID PANEL
Cholesterol: 150 mg/dL (ref 28–200)
HDL: 53.7 mg/dL
LDL Cholesterol: 65 mg/dL (ref 10–99)
NonHDL: 96.67
Total CHOL/HDL Ratio: 3
Triglycerides: 157 mg/dL — ABNORMAL HIGH (ref 10.0–149.0)
VLDL: 31.4 mg/dL (ref 0.0–40.0)

## 2024-11-13 LAB — HEMOGLOBIN A1C: Hgb A1c MFr Bld: 5.6 % (ref 4.6–6.5)

## 2024-11-14 ENCOUNTER — Ambulatory Visit: Payer: Self-pay

## 2024-11-14 NOTE — Progress Notes (Signed)
 Hold results to discuss during upcoming office visit.   Christine Shade, MD

## 2024-11-15 ENCOUNTER — Ambulatory Visit: Payer: Self-pay | Admitting: Cardiology

## 2024-11-17 ENCOUNTER — Other Ambulatory Visit

## 2024-11-17 ENCOUNTER — Other Ambulatory Visit
Admission: RE | Admit: 2024-11-17 | Discharge: 2024-11-17 | Disposition: A | Discharge status: Home / Self Care | Payer: Self-pay | Source: Ambulatory Visit | Attending: Medical Genetics | Admitting: Medical Genetics

## 2024-11-17 NOTE — Progress Notes (Signed)
 Remote ICD Transmission

## 2024-11-18 ENCOUNTER — Encounter

## 2024-11-18 ENCOUNTER — Ambulatory Visit

## 2024-11-25 LAB — GENECONNECT MOLECULAR SCREEN: Genetic Analysis Overall Interpretation: NEGATIVE

## 2024-12-02 ENCOUNTER — Ambulatory Visit (INDEPENDENT_AMBULATORY_CARE_PROVIDER_SITE_OTHER)

## 2024-12-02 VITALS — BP 120/82 | HR 97 | Temp 98.9°F | Ht 68.0 in | Wt 200.8 lb

## 2024-12-02 DIAGNOSIS — E66811 Obesity, class 1: Secondary | ICD-10-CM | POA: Diagnosis not present

## 2024-12-02 DIAGNOSIS — E782 Mixed hyperlipidemia: Secondary | ICD-10-CM

## 2024-12-02 DIAGNOSIS — I4729 Other ventricular tachycardia: Secondary | ICD-10-CM | POA: Insufficient documentation

## 2024-12-02 DIAGNOSIS — R7303 Prediabetes: Secondary | ICD-10-CM | POA: Diagnosis not present

## 2024-12-02 DIAGNOSIS — Z1231 Encounter for screening mammogram for malignant neoplasm of breast: Secondary | ICD-10-CM

## 2024-12-02 DIAGNOSIS — Z789 Other specified health status: Secondary | ICD-10-CM

## 2024-12-02 MED ORDER — TIRZEPATIDE-WEIGHT MANAGEMENT 2.5 MG/0.5ML ~~LOC~~ SOLN: 2.5000 mg | SUBCUTANEOUS | 0 refills | Status: AC

## 2024-12-02 MED ORDER — TIRZEPATIDE-WEIGHT MANAGEMENT 5 MG/0.5ML ~~LOC~~ SOLN: 5.0000 mg | SUBCUTANEOUS | 1 refills | Status: AC

## 2024-12-02 NOTE — Progress Notes (Signed)
 Results discussed during 12/02/2024 visit.  Luke Shade, MD

## 2024-12-02 NOTE — Assessment & Plan Note (Addendum)
 Continue Metoprolol  succinate 25 mg BID. Recommend reaching out to our clinic if she has not heard from cardiology within 1-2 weeks. Patient asymptomatic now.

## 2024-12-02 NOTE — Assessment & Plan Note (Deleted)
" °  Orders:   tirzepatide  (ZEPBOUND ) 2.5 MG/0.5ML injection vial; Inject 2.5 mg into the skin once a week.   tirzepatide  5 MG/0.5ML injection vial; Inject 5 mg into the skin once a week.  "

## 2024-12-02 NOTE — Assessment & Plan Note (Signed)
 Goal weight 170 pounds.  Discussed s/e risks and benefits to Zepbound .  No h/o  MEN II, pancreatitis, medullary thyroid  cancer.  Given her h/o GERD I am concerned about potential GI s/e. Patient to reach out to our clinic if s/e occurs.  Due to cost, zepbound  sent to [facility] direct, no prior auth needed.  Start medication as outlined: Orders:   tirzepatide  (ZEPBOUND ) 2.5 MG/0.5ML injection vial; Inject 2.5 mg into the skin once a week.   tirzepatide  5 MG/0.5ML injection vial; Inject 5 mg into the skin once a week.

## 2024-12-02 NOTE — Assessment & Plan Note (Signed)
 Restart Repatha  120 mg/ml twice a month. No improvement in back, neck pain when holding off Repatha . Continue bimonthly repatha . Not needing refill. Not needing refill at this time.

## 2024-12-02 NOTE — Assessment & Plan Note (Signed)
 A1c significantly improved. Low concern for progression to diabetes. Recommend continue healthy lifestyle choices and intermittent A1c check.

## 2024-12-02 NOTE — Assessment & Plan Note (Signed)
 Restart Repatha . No improvement in back, neck pain when holding off Repatha . Continue bimonthly repatha . Not needing refill.

## 2024-12-02 NOTE — Patient Instructions (Signed)
 YOUR MAMMOGRAM IS DUE after 01/29/25 PLEASE CALL AND GET THIS SCHEDULED! [facility] - call [phone]

## 2024-12-02 NOTE — Assessment & Plan Note (Addendum)
 Due for screening mammogram after 01/29/25. Recommend patient calls to schedule screening mammogram.  Orders:   MM 3D SCREENING MAMMOGRAM BILATERAL BREAST; Future

## 2024-12-02 NOTE — Progress Notes (Signed)
 "  Established Patient Office Visit   Subjective  Patient ID: Christine Fitzpatrick, female    DOB: 1955/12/20  Age: 69 y.o. MRN: [id number]  Chief Complaint  Patient presents with   Hyperlipidemia   Weight Check    Discussed the use of AI scribe software for clinical note transcription with the patient, who gave verbal consent to proceed.  History of Present Illness Patient with h/o SVT cardiac arrest, s/p icd on Toprol  25 mg BID presents for follow up on weight, discuss recent labs.  She has a h/o statin intolerance. Repatha  held due to concerns of MSK pain. She has not noted improvement in arthritic pain of back since holding off on Repatha .   Goal weight 170 pounds. Has a h/o prediabetes with recent lab showing improvement in A1c. Previously prescribed Zepbound  not able to start due to cost. No h/o MEN II, pancreatitis, medullary thyroid  cancer. She has a h/o GERD (triggered by dairy, chocolates). Takes prn Famotidine 40 mg for GERD symptoms.   She had an episode of SVT on 12/01/24. Asymptomatic since then. She was resting watching TV when this happened. She has reached out to cardiology about this. She takes Metoprolol  25 mg BID (succinate) for this.     ROS As per HPI    Objective:     BP 120/82 (BP Location: Left Arm, Patient Position: Sitting)   Pulse 97   Temp 98.9 F (37.2 C) (Oral)   Ht 5' 8 (1.727 m)   Wt 200 lb 12.8 oz (91.1 kg)   SpO2 98%   BMI 30.53 kg/m      12/02/2024   10:35 AM 08/06/2024    8:12 AM 05/13/2024    4:13 PM  Depression screen PHQ 2/9  Decreased Interest 0 0 0  Down, Depressed, Hopeless 0 0 0  PHQ - 2 Score 0 0 0  Altered sleeping 0 0 0  Tired, decreased energy 0 0 0  Change in appetite 0 0 0  Feeling bad or failure about yourself  0 0 0  Trouble concentrating 0 0 0  Moving slowly or fidgety/restless 0 0 0  Suicidal thoughts 0 0 0  PHQ-9 Score 0 0  0   Difficult doing work/chores Not difficult at all Not difficult at all Not difficult at all      Data saved with a previous flowsheet row definition      12/02/2024   10:35 AM 08/06/2024    8:12 AM 05/13/2024    4:14 PM 03/07/2024    8:37 AM  GAD 7 : Generalized Anxiety Score  Nervous, Anxious, on Edge 0 0  0  0   Control/stop worrying 0 0  0  0   Worry too much - different things 0 0  0  0   Trouble relaxing 0 0  0  0   Restless 0 0  0  0   Easily annoyed or irritable 0 0  0  0   Afraid - awful might happen 0 0  0  0   Total GAD 7 Score 0 0 0 0  Anxiety Difficulty Not difficult at all Not difficult at all Not difficult at all Not difficult at all     Data saved with a previous flowsheet row definition      12/02/2024   10:35 AM 08/06/2024    8:12 AM 05/13/2024    4:13 PM  Depression screen PHQ 2/9  Decreased Interest 0 0 0  Down, Depressed, Hopeless  0 0 0  PHQ - 2 Score 0 0 0  Altered sleeping 0 0 0  Tired, decreased energy 0 0 0  Change in appetite 0 0 0  Feeling bad or failure about yourself  0 0 0  Trouble concentrating 0 0 0  Moving slowly or fidgety/restless 0 0 0  Suicidal thoughts 0 0 0  PHQ-9 Score 0 0  0   Difficult doing work/chores Not difficult at all Not difficult at all Not difficult at all     Data saved with a previous flowsheet row definition      12/02/2024   10:35 AM 08/06/2024    8:12 AM 05/13/2024    4:14 PM 03/07/2024    8:37 AM  GAD 7 : Generalized Anxiety Score  Nervous, Anxious, on Edge 0 0  0  0   Control/stop worrying 0 0  0  0   Worry too much - different things 0 0  0  0   Trouble relaxing 0 0  0  0   Restless 0 0  0  0   Easily annoyed or irritable 0 0  0  0   Afraid - awful might happen 0 0  0  0   Total GAD 7 Score 0 0 0 0  Anxiety Difficulty Not difficult at all Not difficult at all Not difficult at all Not difficult at all     Data saved with a previous flowsheet row definition   SDOH Screenings   Food Insecurity: No Food Insecurity (11/28/2024)  Housing: Unknown (11/28/2024)  Transportation Needs: No Transportation Needs  (11/28/2024)  Utilities: Not At Risk (06/11/2024)   Received from [facility]  Alcohol Screen: Low Risk (03/07/2024)  Depression (PHQ2-9): Low Risk (12/02/2024)  Financial Resource Strain: Low Risk (11/28/2024)  Physical Activity: Insufficiently Active (11/28/2024)  Social Connections: Socially Integrated (11/28/2024)  Stress: No Stress Concern Present (11/28/2024)  Tobacco Use: Low Risk (12/02/2024)  Health Literacy: Adequate Health Literacy (03/07/2024)     Physical Exam Constitutional:      General: She is not in acute distress. HENT:     Head: Normocephalic and atraumatic.  Cardiovascular:     Rate and Rhythm: Normal rate.     Heart sounds: No murmur heard. Pulmonary:     Effort: Pulmonary effort is normal.     Breath sounds: Normal breath sounds.  Abdominal:     Tenderness: There is no abdominal tenderness. There is no guarding.  Musculoskeletal:     Cervical back: Normal range of motion and neck supple. No rigidity.     Right lower leg: No edema.     Left lower leg: No edema.  Neurological:     Mental Status: She is alert and oriented to person, place, and time.  Psychiatric:        Mood and Affect: Mood normal.        No results found for any visits on 12/02/24.  The ASCVD Risk score (Arnett DK, et al., 2019) failed to calculate for the following reasons:   Unable to determine if patient is Non-Hispanic African American    Following lab results discussed:  Component     Latest Ref Rng 11/13/2024  Cholesterol     28 - 200 mg/dL 849   Triglycerides     10.0 - 149.0 mg/dL 842.9 (H)   HDL Cholesterol     >39.00 mg/dL 46.29   VLDL     0.0 - 40.0 mg/dL 31.4  LDL (calc)     10 - 99 mg/dL 65   Total CHOL/HDL Ratio 3   NonHDL 96.67   Hemoglobin A1C     4.6 - 6.5 % 5.6    Assessment & Plan:   Assessment & Plan Obesity, class 1 Goal weight 170 pounds.  Discussed s/e risks and benefits to Zepbound .  No h/o  MEN II, pancreatitis, medullary thyroid   cancer.  Given her h/o GERD I am concerned about potential GI s/e. Patient to reach out to our clinic if s/e occurs.  Due to cost, zepbound  sent to [facility], no prior auth needed.  Start medication as outlined: Orders:   tirzepatide  (ZEPBOUND ) 2.5 MG/0.5ML injection vial; Inject 2.5 mg into the skin once a week.   tirzepatide  5 MG/0.5ML injection vial; Inject 5 mg into the skin once a week.  NSVT (nonsustained ventricular tachycardia) ([name]) Continue Metoprolol  succinate 25 mg BID. Recommend reaching out to our clinic if she has not heard from cardiology within 1-2 weeks. Patient asymptomatic now.     Encounter for screening mammogram for breast cancer Due for screening mammogram after 01/29/25. Recommend patient calls to schedule screening mammogram.  Orders:   MM 3D SCREENING MAMMOGRAM BILATERAL BREAST; Future  Statin intolerance Restart Repatha . No improvement in back, neck pain when holding off Repatha . Continue bimonthly repatha . Not needing refill.     Prediabetes A1c significantly improved. Low concern for progression to diabetes. Recommend continue healthy lifestyle choices and intermittent A1c check.     Mixed hyperlipidemia Restart Repatha  120 mg/ml twice a month. No improvement in back, neck pain when holding off Repatha . Continue bimonthly repatha . Not needing refill. Not needing refill at this time.       Return in about 3 months (around 03/02/2025) for weight follow up in 3 months .   Luke Shade, MD "

## 2024-12-03 ENCOUNTER — Ambulatory Visit: Attending: Cardiology | Admitting: Cardiology

## 2024-12-03 ENCOUNTER — Telehealth: Payer: Self-pay

## 2024-12-03 ENCOUNTER — Encounter: Payer: Self-pay | Admitting: Cardiology

## 2024-12-03 VITALS — BP 120/74 | HR 100 | Ht 68.0 in | Wt 200.0 lb

## 2024-12-03 DIAGNOSIS — Z8674 Personal history of sudden cardiac arrest: Secondary | ICD-10-CM | POA: Diagnosis not present

## 2024-12-03 DIAGNOSIS — I4729 Other ventricular tachycardia: Secondary | ICD-10-CM | POA: Diagnosis not present

## 2024-12-03 DIAGNOSIS — Z95810 Presence of automatic (implantable) cardiac defibrillator: Secondary | ICD-10-CM | POA: Insufficient documentation

## 2024-12-03 DIAGNOSIS — I472 Ventricular tachycardia, unspecified: Secondary | ICD-10-CM | POA: Insufficient documentation

## 2024-12-03 LAB — CUP PACEART INCLINIC DEVICE CHECK
Date Time Interrogation Session: 20260128152340
Implantable Lead Connection Status: 753985
Implantable Lead Implant Date: 20130501
Implantable Lead Location: 753860
Implantable Pulse Generator Implant Date: 20220322

## 2024-12-03 NOTE — Telephone Encounter (Signed)
 Please refer to 1/28 regarding transmission review.

## 2024-12-03 NOTE — Patient Instructions (Addendum)
 Medication Instructions:  Your physician recommends that you continue on your current medications as directed. Please refer to the Current Medication list given to you today.  *If you need a refill on your cardiac medications before your next appointment, please call your pharmacy*  Lab Work: Your provider would like for you to have following labs drawn today CBC, CMP, TSH and T4.     Testing/Procedures:  NO driving for six months.      Please report to Radiology at the [facility] Main Entrance 30 minutes early for your test.  [address] [place], [place] [address]                         OR   Please report to Radiology at [facility] Main Entrance, medical mall, 30 mins prior to your test.  [address]  [place], [place]  How to Prepare for Your Cardiac PET/CT Stress Test:  Nothing to eat or drink, except water, 3 hours prior to arrival time.  NO caffeine/decaffeinated products, or chocolate 12 hours prior to arrival. (Please note decaffeinated beverages (teas/coffees) still contain caffeine).  If you have caffeine within 12 hours prior, the test will need to be rescheduled.  Medication instructions: Hold theophylline containing medications for 12 hours.    You may take your remaining medications with water.  NO perfume, cologne or lotion on chest or abdomen area. FEMALES - Please avoid wearing dresses to this appointment.  Total time is 1 to 2 hours; you may want to bring reading material for the waiting time.  IF YOU THINK YOU MAY BE PREGNANT, OR ARE NURSING PLEASE INFORM THE TECHNOLOGIST.  In preparation for your appointment, medication and supplies will be purchased.  Appointment availability is limited, so if you need to cancel or reschedule, please call the Radiology Department Scheduler at [phone] 24 hours in advance to avoid a cancellation fee of $100.00  What to Expect When you Arrive:  Once you arrive and check  in for your appointment, you will be taken to a preparation room within the Radiology Department.  A technologist or Nurse will obtain your medical history, verify that you are correctly prepped for the exam, and explain the procedure.  Afterwards, an IV will be started in your arm and electrodes will be placed on your skin for EKG monitoring during the stress portion of the exam. Then you will be escorted to the PET/CT scanner.  There, staff will get you positioned on the scanner and obtain a blood pressure and EKG.  During the exam, you will continue to be connected to the EKG and blood pressure machines.  A small, safe amount of a radioactive tracer will be injected in your IV to obtain a series of pictures of your heart along with an injection of a stress agent.    After your Exam:  It is recommended that you eat a meal and drink a caffeinated beverage to counter act any effects of the stress agent.  Drink plenty of fluids for the remainder of the day and urinate frequently for the first couple of hours after the exam.  Your doctor will inform you of your test results within 7-10 business days.  For more information and frequently asked questions, please visit our website: [email or web address]  For questions about your test or how to prepare for your test, please call: Cardiac Imaging Nurse Navigators Office: [phone]  For billing questions, please call  [phone].    Follow-Up: At [facility], you and your health needs are our priority.  As part of our continuing mission to provide you with exceptional heart care, our providers are all part of one team.  This team includes your primary Cardiologist (physician) and Advanced Practice Providers or APPs (Physician Assistants and Nurse Practitioners) who all work together to provide you with the care you need, when you need it.  Your next appointment:   3 week(s)  Provider:   Suzann Riddle, NP

## 2024-12-03 NOTE — Telephone Encounter (Signed)
 MDT ICD ALERT:  Unscheduled remote transmission: Patient initiated Normal device function.   1 VF classified event 1/26 @ 12:24, duration 8sec, HR 214.  EGM c/w likely sustained VT pace terminated with 1 burst of ATP   Per patient's my chart message on 1/26:  Can someone check my device? Around 1120am is, I was sitting watching TV and I felt this overwhelming feeling that I was going to pass out. I got really dizzy, so I started coughing and came out of it. Please advise.   Current medication: metoprolol  succinate 25mg  bid  ______________________________________________________________________________________  LM for patient to return call and discuss.

## 2024-12-03 NOTE — Progress Notes (Signed)
 "     Electrophysiology Clinic Note    Date:  12/03/2024  Patient ID:  Christine Fitzpatrick, Christine Fitzpatrick October 31, 1956, MRN [id number] PCP:  Abbey Bruckner, MD  Cardiologist:  None   Electrophysiologist:  OLE ONEIDA HOLTS, MD (Inactive)  Electrophysiology APP:  Jaspal Pultz, NP   Discussed the use of AI scribe software for clinical note transcription with the patient, who gave verbal consent to proceed.   Patient Profile    Chief Complaint: palpitation, ICD follow-up  History of Present Illness: Christine Fitzpatrick is a 69 y.o. female with PMH notable for SVT, cardiac arrest, VT s/p ICD, PVC, PAC ; seen today for OLE ONEIDA HOLTS, MD (Inactive) for routine electrophysiology followup.  She is s/p EP study 07/2022 where SVT could not be induced.   Historically, she is mildly symptomatic of PVC and PACs, and  quite symptomatic with SVT episodes with some improvement with adjusting toprol  to BID.   She called clinic earlier this week with episode of dizziness, presyncope while sitting. Denies chest pain, chest pressure during episode.  She denies any concerning symptoms earlier in the day, no sick contacts, no missed doses of metoprolol . She was not overly active earlier that same day.   Currently, she denies any acute complaints.    Her husband joins for today's appointment   Arrhythmia/Device History MDT single chamber ICD, imp 03/2012; dx Cardiac arrest  - gen change 2022  AAD - Flec previously    ROS:  Please see the history of present illness. All other systems are reviewed and otherwise negative.    Physical Exam    VS:  BP 120/74 (BP Location: Left Arm, Patient Position: Sitting, Cuff Size: Normal)   Pulse 100   Ht 5' 8 (1.727 m)   Wt 200 lb (90.7 kg)   SpO2 99%   BMI 30.41 kg/m  BMI: Body mass index is 30.41 kg/m.      Wt Readings from Last 3 Encounters:  12/03/24 200 lb (90.7 kg)  12/02/24 200 lb 12.8 oz (91.1 kg)  09/24/24 195 lb 12.8 oz (88.8 kg)     GEN- The patient is  well appearing, alert and oriented x 3 today.   Lungs- Clear to ausculation bilaterally, normal work of breathing.  Heart- Regular rate and rhythm, no murmurs, rubs or gallops Extremities- No peripheral edema, warm, dry Skin-  device pocket well-healed, no tethering   Device interrogation done today and reviewed by myself:  Battery 10.4 years Lead thresholds, impedence, sensing stable  VP < 1%  VT episode 639 -  CL 270-314ms, appears MM VT ATP x 1 with clean break to sinus rhythm    Studies Reviewed   Previous EP, cardiology notes.    EKG is ordered. Personal review of EKG from today shows:    EKG Interpretation Date/Time:  Wednesday December 03 2024 14:15:46 EST Ventricular Rate:  100 PR Interval:  174 QRS Duration:  88 QT Interval:  356 QTC Calculation: 459 R Axis:   14  Text Interpretation: Normal sinus rhythm Possible Left atrial enlargement Confirmed by Jaylynne Birkhead [phone]) on 12/03/2024 3:25:25 PM    Long term monitor, 07/11/2023 HR 59 - 218, average 84 bpm. 3 NSVT, longest 4 beats. 7 nonsustained SVT, longest 16 beats. Rare supraventricular ectopy. Occasional ventricular ectopy, 2%. No sustained arrhythmias. Symptom trigger episodes correspond to sinus rhythm with PVC/PAC.   30 day Ambulatory monitor, 08/29/2022 HR 62 - 157, average 85 bpm. No atrial fibrillation detected. Occasional ventricular ectopy, 2% Rare  supraventricular ectopy, <1%.   Ambulatory monitor, 06/07/2021 HR 57 - 160bpm, average 83 bpm. 5 SVT, longest lasting 4 beats.  Rare supraventricular ectopy. 1.2% PVC burden. No sustained arrhythmias.  TTE, 04/09/2019 Summary --- Estimated LVEF 60%   Normal LV size and function.   Mild mitral regurgitation.   Mild tricuspid regurgitation with normal PAP.   Defibrillator wire is visualized within the right heart.    Assessment and Plan     #) VT #) h/o cardiac arrest s/p ICD Recent episode of MM VT, received ATP x 1 with clean break No chest  pain, chest pressure prior to episode Will update ischemic eval with PET CT Update CMP, mag, CBC, thyroid  labs today No driving for 6 mon per [place] DMV Continue 25mg  toprol  BID, do not favor amiodarone given young age.   Will discuss further with Dr. Kennyth    Informed Consent   Shared Decision Making/Informed Consent The risks [chest pain, shortness of breath, cardiac arrhythmias, dizziness, blood pressure fluctuations, myocardial infarction, stroke/transient ischemic attack, nausea, vomiting, allergic reaction, radiation exposure, metallic taste sensation and life-threatening complications (estimated to be 1 in 10,000)], benefits (risk stratification, diagnosing coronary artery disease, treatment guidance) and alternatives of a cardiac PET stress test were discussed in detail with Ms. Teat and she agrees to proceed.       Current medicines are reviewed at length with the patient today.   The patient does not have concerns regarding her medicines.  The following changes were made today:  none  Labs/ tests ordered today include:  Orders Placed This Encounter  Procedures   NM PET CT CARDIAC PERFUSION MULTI W/ABSOLUTE BLOODFLOW   CBC   Comprehensive metabolic panel with GFR   TSH   T4, free   Cardiac Stress Test: Informed Consent Details: Physician/Practitioner Attestation; Transcribe to consent form and obtain patient signature   [facility]CL[facility] DEVICE CHECK   EKG 12-Lead     Disposition: Follow up with Dr. Kennyth or EP APP in 4 weeks   Signed, Phelan Schadt, NP  12/03/24  3:38 PM  Electrophysiology [facility] "

## 2024-12-04 ENCOUNTER — Ambulatory Visit: Payer: Self-pay | Admitting: Cardiology

## 2024-12-04 LAB — COMPREHENSIVE METABOLIC PANEL WITH GFR
ALT: 41 [IU]/L — ABNORMAL HIGH (ref 0–32)
AST: 23 [IU]/L (ref 0–40)
Albumin: 4.5 g/dL (ref 3.9–4.9)
Alkaline Phosphatase: 106 [IU]/L (ref 49–135)
BUN/Creatinine Ratio: 13 (ref 12–28)
BUN: 13 mg/dL (ref 8–27)
Bilirubin Total: 0.3 mg/dL (ref 0.0–1.2)
CO2: 18 mmol/L — ABNORMAL LOW (ref 20–29)
Calcium: 10.2 mg/dL (ref 8.7–10.3)
Chloride: 104 mmol/L (ref 96–106)
Creatinine, Ser: 0.98 mg/dL (ref 0.57–1.00)
Globulin, Total: 2.5 g/dL (ref 1.5–4.5)
Glucose: 135 mg/dL — ABNORMAL HIGH (ref 70–99)
Potassium: 4.2 mmol/L (ref 3.5–5.2)
Sodium: 141 mmol/L (ref 134–144)
Total Protein: 7 g/dL (ref 6.0–8.5)
eGFR: 64 mL/min/{1.73_m2}

## 2024-12-04 LAB — T4, FREE: Free T4: 1.31 ng/dL (ref 0.82–1.77)

## 2024-12-04 LAB — CBC
Hematocrit: 45.9 % (ref 34.0–46.6)
Hemoglobin: 15.2 g/dL (ref 11.1–15.9)
MCH: 31.3 pg (ref 26.6–33.0)
MCHC: 33.1 g/dL (ref 31.5–35.7)
MCV: 95 fL (ref 79–97)
Platelets: 310 10*3/uL (ref 150–450)
RBC: 4.85 x10E6/uL (ref 3.77–5.28)
RDW: 11.9 % (ref 11.7–15.4)
WBC: 9.6 10*3/uL (ref 3.4–10.8)

## 2024-12-04 LAB — TSH: TSH: 1.8 u[IU]/mL (ref 0.450–4.500)

## 2024-12-18 ENCOUNTER — Ambulatory Visit

## 2025-01-01 ENCOUNTER — Ambulatory Visit: Admitting: Cardiology

## 2025-01-05 ENCOUNTER — Ambulatory Visit: Admitting: Podiatry

## 2025-01-30 ENCOUNTER — Encounter

## 2025-02-11 ENCOUNTER — Ambulatory Visit

## 2025-03-04 ENCOUNTER — Ambulatory Visit

## 2025-05-13 ENCOUNTER — Ambulatory Visit

## 2025-08-12 ENCOUNTER — Ambulatory Visit
